# Patient Record
Sex: Female | Born: 1980 | Race: Black or African American | Hispanic: No | Marital: Single | Smoking: Never smoker
Health system: Southern US, Community
[De-identification: ages and names within clinical notes are randomized; demographics above are authoritative.]

## PROBLEM LIST (undated history)

## (undated) DIAGNOSIS — N390 Urinary tract infection, site not specified: Secondary | ICD-10-CM

## (undated) DIAGNOSIS — J45909 Unspecified asthma, uncomplicated: Secondary | ICD-10-CM

## (undated) DIAGNOSIS — K219 Gastro-esophageal reflux disease without esophagitis: Secondary | ICD-10-CM

## (undated) DIAGNOSIS — N12 Tubulo-interstitial nephritis, not specified as acute or chronic: Secondary | ICD-10-CM

## (undated) DIAGNOSIS — A599 Trichomoniasis, unspecified: Secondary | ICD-10-CM

## (undated) DIAGNOSIS — A749 Chlamydial infection, unspecified: Secondary | ICD-10-CM

## (undated) DIAGNOSIS — N83209 Unspecified ovarian cyst, unspecified side: Secondary | ICD-10-CM

## (undated) HISTORY — PX: CARDIAC SURGERY: SHX584

## (undated) HISTORY — PX: HERNIA REPAIR: SHX51

---

## 1999-12-18 ENCOUNTER — Ambulatory Visit (HOSPITAL_COMMUNITY): Admission: RE | Admit: 1999-12-18 | Discharge: 1999-12-18 | Payer: Self-pay | Admitting: General Surgery

## 2000-03-06 ENCOUNTER — Inpatient Hospital Stay (HOSPITAL_COMMUNITY): Admission: AD | Admit: 2000-03-06 | Discharge: 2000-03-06 | Payer: Self-pay | Admitting: Obstetrics

## 2002-04-13 ENCOUNTER — Other Ambulatory Visit: Admission: RE | Admit: 2002-04-13 | Discharge: 2002-04-13 | Payer: Self-pay | Admitting: Gynecology

## 2002-12-14 ENCOUNTER — Encounter: Payer: Self-pay | Admitting: Obstetrics & Gynecology

## 2002-12-14 ENCOUNTER — Ambulatory Visit (HOSPITAL_COMMUNITY): Admission: RE | Admit: 2002-12-14 | Discharge: 2002-12-14 | Payer: Self-pay | Admitting: Obstetrics & Gynecology

## 2003-01-23 ENCOUNTER — Inpatient Hospital Stay (HOSPITAL_COMMUNITY): Admission: AD | Admit: 2003-01-23 | Discharge: 2003-01-23 | Payer: Self-pay | Admitting: Obstetrics

## 2003-03-09 ENCOUNTER — Encounter: Payer: Self-pay | Admitting: Obstetrics & Gynecology

## 2003-03-09 ENCOUNTER — Ambulatory Visit (HOSPITAL_COMMUNITY): Admission: RE | Admit: 2003-03-09 | Discharge: 2003-03-09 | Payer: Self-pay | Admitting: Obstetrics & Gynecology

## 2003-03-23 ENCOUNTER — Encounter (INDEPENDENT_AMBULATORY_CARE_PROVIDER_SITE_OTHER): Payer: Self-pay | Admitting: Specialist

## 2003-03-23 ENCOUNTER — Observation Stay (HOSPITAL_COMMUNITY): Admission: RE | Admit: 2003-03-23 | Discharge: 2003-03-24 | Payer: Self-pay | Admitting: General Surgery

## 2003-04-12 ENCOUNTER — Inpatient Hospital Stay (HOSPITAL_COMMUNITY): Admission: AD | Admit: 2003-04-12 | Discharge: 2003-04-14 | Payer: Self-pay | Admitting: Obstetrics

## 2003-06-17 ENCOUNTER — Inpatient Hospital Stay (HOSPITAL_COMMUNITY): Admission: AD | Admit: 2003-06-17 | Discharge: 2003-06-17 | Payer: Self-pay | Admitting: Obstetrics

## 2003-06-18 ENCOUNTER — Inpatient Hospital Stay (HOSPITAL_COMMUNITY): Admission: AD | Admit: 2003-06-18 | Discharge: 2003-06-18 | Payer: Self-pay | Admitting: Obstetrics & Gynecology

## 2003-07-07 ENCOUNTER — Inpatient Hospital Stay (HOSPITAL_COMMUNITY): Admission: AD | Admit: 2003-07-07 | Discharge: 2003-07-09 | Payer: Self-pay | Admitting: Obstetrics & Gynecology

## 2003-07-14 ENCOUNTER — Inpatient Hospital Stay (HOSPITAL_COMMUNITY): Admission: AD | Admit: 2003-07-14 | Discharge: 2003-07-14 | Payer: Self-pay | Admitting: Obstetrics

## 2003-10-06 ENCOUNTER — Emergency Department (HOSPITAL_COMMUNITY): Admission: EM | Admit: 2003-10-06 | Discharge: 2003-10-06 | Payer: Self-pay | Admitting: Emergency Medicine

## 2004-05-27 ENCOUNTER — Inpatient Hospital Stay (HOSPITAL_COMMUNITY): Admission: AD | Admit: 2004-05-27 | Discharge: 2004-05-27 | Payer: Self-pay | Admitting: Obstetrics

## 2004-06-09 ENCOUNTER — Ambulatory Visit (HOSPITAL_COMMUNITY): Admission: RE | Admit: 2004-06-09 | Discharge: 2004-06-09 | Payer: Self-pay | Admitting: Obstetrics

## 2004-07-17 ENCOUNTER — Inpatient Hospital Stay (HOSPITAL_COMMUNITY): Admission: AD | Admit: 2004-07-17 | Discharge: 2004-07-18 | Payer: Self-pay | Admitting: Obstetrics & Gynecology

## 2004-08-19 ENCOUNTER — Inpatient Hospital Stay (HOSPITAL_COMMUNITY): Admission: AD | Admit: 2004-08-19 | Discharge: 2004-08-19 | Payer: Self-pay | Admitting: Obstetrics & Gynecology

## 2004-09-23 ENCOUNTER — Inpatient Hospital Stay (HOSPITAL_COMMUNITY): Admission: AD | Admit: 2004-09-23 | Discharge: 2004-09-23 | Payer: Self-pay | Admitting: *Deleted

## 2004-09-24 ENCOUNTER — Inpatient Hospital Stay (HOSPITAL_COMMUNITY): Admission: AD | Admit: 2004-09-24 | Discharge: 2004-09-25 | Payer: Self-pay | Admitting: Obstetrics

## 2004-10-03 ENCOUNTER — Inpatient Hospital Stay (HOSPITAL_COMMUNITY): Admission: AD | Admit: 2004-10-03 | Discharge: 2004-10-03 | Payer: Self-pay | Admitting: Obstetrics

## 2004-10-26 ENCOUNTER — Inpatient Hospital Stay (HOSPITAL_COMMUNITY): Admission: AD | Admit: 2004-10-26 | Discharge: 2004-10-26 | Payer: Self-pay | Admitting: Obstetrics & Gynecology

## 2004-11-20 ENCOUNTER — Inpatient Hospital Stay (HOSPITAL_COMMUNITY): Admission: AD | Admit: 2004-11-20 | Discharge: 2004-11-21 | Payer: Self-pay | Admitting: Obstetrics & Gynecology

## 2004-12-08 ENCOUNTER — Inpatient Hospital Stay (HOSPITAL_COMMUNITY): Admission: AD | Admit: 2004-12-08 | Discharge: 2004-12-10 | Payer: Self-pay | Admitting: Obstetrics

## 2007-01-21 ENCOUNTER — Emergency Department (HOSPITAL_COMMUNITY): Admission: EM | Admit: 2007-01-21 | Discharge: 2007-01-21 | Payer: Self-pay | Admitting: *Deleted

## 2007-03-21 ENCOUNTER — Encounter (INDEPENDENT_AMBULATORY_CARE_PROVIDER_SITE_OTHER): Payer: Self-pay | Admitting: Nurse Practitioner

## 2007-03-21 ENCOUNTER — Ambulatory Visit: Payer: Self-pay | Admitting: Family Medicine

## 2007-03-21 ENCOUNTER — Ambulatory Visit: Payer: Self-pay | Admitting: *Deleted

## 2007-03-21 LAB — CONVERTED CEMR LAB
ALT: 18 units/L (ref 0–35)
AST: 19 units/L (ref 0–37)
Alkaline Phosphatase: 78 units/L (ref 39–117)
BUN: 10 mg/dL (ref 6–23)
Basophils Relative: 1 % (ref 0–1)
Calcium: 10.1 mg/dL (ref 8.4–10.5)
Creatinine, Ser: 0.72 mg/dL (ref 0.40–1.20)
Eosinophils Absolute: 0.1 10*3/uL (ref 0.0–0.7)
Eosinophils Relative: 1 % (ref 0–5)
Hemoglobin: 14.9 g/dL (ref 12.0–15.0)
MCV: 90.2 fL (ref 78.0–100.0)
Neutro Abs: 2.2 10*3/uL (ref 1.7–7.7)
Neutrophils Relative %: 40 % — ABNORMAL LOW (ref 43–77)
Platelets: 517 10*3/uL — ABNORMAL HIGH (ref 150–400)
RDW: 13.2 % (ref 11.5–14.0)
TSH: 0.881 microintl units/mL (ref 0.350–5.50)
Total Protein: 8.3 g/dL (ref 6.0–8.3)

## 2007-03-29 ENCOUNTER — Emergency Department (HOSPITAL_COMMUNITY): Admission: EM | Admit: 2007-03-29 | Discharge: 2007-03-29 | Payer: Self-pay | Admitting: Emergency Medicine

## 2007-04-10 ENCOUNTER — Ambulatory Visit: Payer: Self-pay | Admitting: Family Medicine

## 2007-05-07 ENCOUNTER — Emergency Department (HOSPITAL_COMMUNITY): Admission: EM | Admit: 2007-05-07 | Discharge: 2007-05-07 | Payer: Self-pay | Admitting: Emergency Medicine

## 2007-07-31 ENCOUNTER — Ambulatory Visit: Payer: Self-pay | Admitting: Family Medicine

## 2007-12-25 ENCOUNTER — Emergency Department (HOSPITAL_COMMUNITY): Admission: EM | Admit: 2007-12-25 | Discharge: 2007-12-25 | Payer: Self-pay | Admitting: Emergency Medicine

## 2008-01-22 ENCOUNTER — Emergency Department (HOSPITAL_COMMUNITY): Admission: EM | Admit: 2008-01-22 | Discharge: 2008-01-22 | Payer: Self-pay | Admitting: Emergency Medicine

## 2008-04-18 ENCOUNTER — Emergency Department (HOSPITAL_COMMUNITY): Admission: EM | Admit: 2008-04-18 | Discharge: 2008-04-18 | Payer: Self-pay | Admitting: Emergency Medicine

## 2008-08-18 ENCOUNTER — Emergency Department (HOSPITAL_COMMUNITY): Admission: EM | Admit: 2008-08-18 | Discharge: 2008-08-18 | Payer: Self-pay | Admitting: Emergency Medicine

## 2008-09-01 ENCOUNTER — Emergency Department (HOSPITAL_COMMUNITY): Admission: EM | Admit: 2008-09-01 | Discharge: 2008-09-01 | Payer: Self-pay | Admitting: Emergency Medicine

## 2008-09-03 ENCOUNTER — Emergency Department (HOSPITAL_COMMUNITY): Admission: EM | Admit: 2008-09-03 | Discharge: 2008-09-03 | Payer: Self-pay | Admitting: Emergency Medicine

## 2009-02-07 ENCOUNTER — Ambulatory Visit: Payer: Self-pay | Admitting: Obstetrics and Gynecology

## 2009-02-07 ENCOUNTER — Inpatient Hospital Stay (HOSPITAL_COMMUNITY): Admission: AD | Admit: 2009-02-07 | Discharge: 2009-02-07 | Payer: Self-pay | Admitting: Obstetrics & Gynecology

## 2009-05-06 ENCOUNTER — Emergency Department (HOSPITAL_COMMUNITY): Admission: EM | Admit: 2009-05-06 | Discharge: 2009-05-07 | Payer: Self-pay | Admitting: Emergency Medicine

## 2009-06-07 ENCOUNTER — Emergency Department (HOSPITAL_COMMUNITY): Admission: EM | Admit: 2009-06-07 | Discharge: 2009-06-08 | Payer: Self-pay | Admitting: Emergency Medicine

## 2009-07-27 ENCOUNTER — Emergency Department (HOSPITAL_COMMUNITY): Admission: EM | Admit: 2009-07-27 | Discharge: 2009-07-28 | Payer: Self-pay | Admitting: Emergency Medicine

## 2009-10-08 ENCOUNTER — Emergency Department (HOSPITAL_COMMUNITY): Admission: EM | Admit: 2009-10-08 | Discharge: 2009-10-08 | Payer: Self-pay | Admitting: Pediatric Emergency Medicine

## 2009-11-08 ENCOUNTER — Emergency Department (HOSPITAL_COMMUNITY): Admission: EM | Admit: 2009-11-08 | Discharge: 2009-11-08 | Payer: Self-pay | Admitting: Emergency Medicine

## 2009-12-19 ENCOUNTER — Ambulatory Visit: Payer: Self-pay | Admitting: Nurse Practitioner

## 2009-12-19 ENCOUNTER — Inpatient Hospital Stay (HOSPITAL_COMMUNITY): Admission: AD | Admit: 2009-12-19 | Discharge: 2009-12-19 | Payer: Self-pay | Admitting: Obstetrics & Gynecology

## 2010-01-16 ENCOUNTER — Emergency Department (HOSPITAL_COMMUNITY): Admission: EM | Admit: 2010-01-16 | Discharge: 2010-01-16 | Payer: Self-pay | Admitting: Emergency Medicine

## 2010-04-17 ENCOUNTER — Inpatient Hospital Stay (HOSPITAL_COMMUNITY)
Admission: AD | Admit: 2010-04-17 | Discharge: 2010-04-17 | Payer: Self-pay | Source: Home / Self Care | Admitting: Obstetrics

## 2010-06-06 ENCOUNTER — Ambulatory Visit (HOSPITAL_COMMUNITY)
Admission: RE | Admit: 2010-06-06 | Discharge: 2010-06-06 | Payer: Self-pay | Source: Home / Self Care | Attending: Obstetrics & Gynecology | Admitting: Obstetrics & Gynecology

## 2010-06-16 ENCOUNTER — Inpatient Hospital Stay (HOSPITAL_COMMUNITY)
Admission: AD | Admit: 2010-06-16 | Discharge: 2010-06-16 | Disposition: A | Discharge status: Still In Hospital | Payer: Self-pay | Source: Home / Self Care | Attending: Obstetrics | Admitting: Obstetrics

## 2010-06-16 LAB — URINALYSIS, ROUTINE W REFLEX MICROSCOPIC
Hgb urine dipstick: NEGATIVE
Ketones, ur: NEGATIVE mg/dL
Urine Glucose, Fasting: NEGATIVE mg/dL
Urobilinogen, UA: 0.2 mg/dL (ref 0.0–1.0)

## 2010-06-16 LAB — URINE MICROSCOPIC-ADD ON

## 2010-06-18 LAB — URINE CULTURE

## 2010-06-22 ENCOUNTER — Inpatient Hospital Stay (HOSPITAL_COMMUNITY)
Admission: RE | Admit: 2010-06-22 | Discharge: 2010-06-23 | Disposition: A | Discharge status: Home / Self Care | Payer: Medicaid Other | Source: Ambulatory Visit | Attending: Obstetrics | Admitting: Obstetrics

## 2010-06-22 DIAGNOSIS — O210 Mild hyperemesis gravidarum: Secondary | ICD-10-CM

## 2010-06-22 LAB — URINALYSIS, ROUTINE W REFLEX MICROSCOPIC
Hgb urine dipstick: NEGATIVE
Ketones, ur: NEGATIVE mg/dL
Specific Gravity, Urine: 1.01 (ref 1.005–1.030)
Urine Glucose, Fasting: NEGATIVE mg/dL
Urobilinogen, UA: 0.2 mg/dL (ref 0.0–1.0)
pH: 6.5 (ref 5.0–8.0)

## 2010-06-23 DIAGNOSIS — O210 Mild hyperemesis gravidarum: Secondary | ICD-10-CM | POA: Insufficient documentation

## 2010-07-11 ENCOUNTER — Emergency Department (HOSPITAL_COMMUNITY): Payer: Medicaid Other

## 2010-07-11 ENCOUNTER — Emergency Department (HOSPITAL_COMMUNITY)
Admission: EM | Admit: 2010-07-11 | Discharge: 2010-07-11 | Disposition: A | Discharge status: Home / Self Care | Payer: Medicaid Other | Attending: Emergency Medicine | Admitting: Emergency Medicine

## 2010-07-11 ENCOUNTER — Inpatient Hospital Stay (HOSPITAL_COMMUNITY)
Admission: AD | Admit: 2010-07-11 | Discharge: 2010-07-11 | Disposition: A | Discharge status: Home / Self Care | Payer: Medicaid Other | Source: Ambulatory Visit | Attending: Obstetrics | Admitting: Obstetrics

## 2010-07-11 DIAGNOSIS — W010XXA Fall on same level from slipping, tripping and stumbling without subsequent striking against object, initial encounter: Secondary | ICD-10-CM | POA: Insufficient documentation

## 2010-07-11 DIAGNOSIS — Y9329 Activity, other involving ice and snow: Secondary | ICD-10-CM | POA: Insufficient documentation

## 2010-07-11 DIAGNOSIS — M25476 Effusion, unspecified foot: Secondary | ICD-10-CM | POA: Insufficient documentation

## 2010-07-11 DIAGNOSIS — O99891 Other specified diseases and conditions complicating pregnancy: Secondary | ICD-10-CM | POA: Insufficient documentation

## 2010-07-11 DIAGNOSIS — J45909 Unspecified asthma, uncomplicated: Secondary | ICD-10-CM | POA: Insufficient documentation

## 2010-07-11 DIAGNOSIS — M25473 Effusion, unspecified ankle: Secondary | ICD-10-CM | POA: Insufficient documentation

## 2010-07-11 DIAGNOSIS — M25579 Pain in unspecified ankle and joints of unspecified foot: Secondary | ICD-10-CM | POA: Insufficient documentation

## 2010-07-11 DIAGNOSIS — Y929 Unspecified place or not applicable: Secondary | ICD-10-CM | POA: Insufficient documentation

## 2010-08-01 LAB — URINE CULTURE
Colony Count: 25000
Culture  Setup Time: 201111282132

## 2010-08-01 LAB — COMPREHENSIVE METABOLIC PANEL
AST: 26 U/L (ref 0–37)
Alkaline Phosphatase: 47 U/L (ref 39–117)
CO2: 21 mEq/L (ref 19–32)
Calcium: 9.1 mg/dL (ref 8.4–10.5)
Chloride: 103 mEq/L (ref 96–112)
GFR calc Af Amer: >60 mL/min (ref >60)
Glucose, Bld: 67 mg/dL — ABNORMAL LOW (ref 70–99)
Potassium: 4 mEq/L (ref 3.5–5.1)
Sodium: 133 mEq/L — ABNORMAL LOW (ref 135–145)

## 2010-08-01 LAB — URINALYSIS, ROUTINE W REFLEX MICROSCOPIC
Nitrite: NEGATIVE
Protein, ur: NEGATIVE mg/dL

## 2010-08-01 LAB — CBC
HCT: 41.8 % (ref 36.0–46.0)
Hemoglobin: 14 g/dL (ref 12.0–15.0)
MCH: 31.3 pg (ref 26.0–34.0)
Platelets: 299 10*3/uL (ref 150–400)
WBC: 6.3 10*3/uL (ref 4.0–10.5)

## 2010-08-01 LAB — WET PREP, GENITAL: Yeast Wet Prep HPF POC: NONE SEEN

## 2010-08-01 LAB — URINE MICROSCOPIC-ADD ON

## 2010-08-01 LAB — GC/CHLAMYDIA PROBE AMP, GENITAL: GC Probe Amp, Genital: NEGATIVE

## 2010-08-04 LAB — URINE CULTURE
Colony Count: 30000
Culture  Setup Time: 201108300007

## 2010-08-04 LAB — URINALYSIS, ROUTINE W REFLEX MICROSCOPIC
Glucose, UA: NEGATIVE mg/dL
Hgb urine dipstick: NEGATIVE
Ketones, ur: 15 mg/dL — AB
Ketones, ur: NEGATIVE mg/dL
Nitrite: NEGATIVE
Protein, ur: NEGATIVE mg/dL
Protein, ur: NEGATIVE mg/dL
Urobilinogen, UA: 1 mg/dL (ref 0.0–1.0)

## 2010-08-04 LAB — URINE MICROSCOPIC-ADD ON

## 2010-08-04 LAB — WET PREP, GENITAL

## 2010-08-04 LAB — POCT PREGNANCY, URINE: Preg Test, Ur: NEGATIVE

## 2010-08-04 LAB — GC/CHLAMYDIA PROBE AMP, GENITAL: Chlamydia, DNA Probe: NEGATIVE

## 2010-08-06 LAB — URINALYSIS, ROUTINE W REFLEX MICROSCOPIC
Bilirubin Urine: NEGATIVE
Glucose, UA: NEGATIVE mg/dL
Glucose, UA: NEGATIVE mg/dL
Hgb urine dipstick: NEGATIVE
Nitrite: NEGATIVE
Specific Gravity, Urine: 1.019 (ref 1.005–1.030)
Specific Gravity, Urine: 1.016 (ref 1.005–1.030)
Urobilinogen, UA: 1 mg/dL (ref 0.0–1.0)
pH: 7 (ref 5.0–8.0)

## 2010-08-06 LAB — BASIC METABOLIC PANEL
CO2: 22 mEq/L (ref 19–32)
Calcium: 9.3 mg/dL (ref 8.4–10.5)
GFR calc Af Amer: >60 mL/min (ref >60)
GFR calc non Af Amer: >60 mL/min (ref >60)
Sodium: 135 mEq/L (ref 135–145)

## 2010-08-06 LAB — URINE MICROSCOPIC-ADD ON

## 2010-08-06 LAB — CBC
Hemoglobin: 14.5 g/dL (ref 12.0–15.0)
RBC: 4.78 MIL/uL (ref 3.87–5.11)

## 2010-08-06 LAB — DIFFERENTIAL
Lymphocytes Relative: 48 % — ABNORMAL HIGH (ref 12–46)
Monocytes Absolute: 0.7 10*3/uL (ref 0.1–1.0)
Monocytes Relative: 9 % (ref 3–12)
Neutro Abs: 3.2 10*3/uL (ref 1.7–7.7)

## 2010-08-06 LAB — POCT PREGNANCY, URINE: Preg Test, Ur: NEGATIVE

## 2010-08-12 ENCOUNTER — Inpatient Hospital Stay (HOSPITAL_COMMUNITY)
Admission: AD | Admit: 2010-08-12 | Discharge: 2010-08-12 | Disposition: A | Discharge status: Home / Self Care | Payer: Medicaid Other | Source: Ambulatory Visit | Attending: Obstetrics | Admitting: Obstetrics

## 2010-08-12 DIAGNOSIS — O99891 Other specified diseases and conditions complicating pregnancy: Secondary | ICD-10-CM | POA: Insufficient documentation

## 2010-08-12 DIAGNOSIS — O9989 Other specified diseases and conditions complicating pregnancy, childbirth and the puerperium: Secondary | ICD-10-CM

## 2010-08-12 DIAGNOSIS — J069 Acute upper respiratory infection, unspecified: Secondary | ICD-10-CM | POA: Insufficient documentation

## 2010-08-12 LAB — URINALYSIS, ROUTINE W REFLEX MICROSCOPIC
Bilirubin Urine: NEGATIVE
Hgb urine dipstick: NEGATIVE
Specific Gravity, Urine: <1.005 — ABNORMAL LOW (ref 1.005–1.030)
Urobilinogen, UA: 0.2 mg/dL (ref 0.0–1.0)

## 2010-08-12 LAB — URINE MICROSCOPIC-ADD ON

## 2010-08-13 LAB — URINE CULTURE: Culture  Setup Time: 201203242030

## 2010-08-21 LAB — DIFFERENTIAL
Basophils Absolute: 0.1 10*3/uL (ref 0.0–0.1)
Basophils Relative: 2 % — ABNORMAL HIGH (ref 0–1)
Lymphocytes Relative: 35 % (ref 12–46)
Neutro Abs: 4 10*3/uL (ref 1.7–7.7)
Neutrophils Relative %: 56 % (ref 43–77)

## 2010-08-21 LAB — URINALYSIS, ROUTINE W REFLEX MICROSCOPIC
Glucose, UA: NEGATIVE mg/dL
Ketones, ur: NEGATIVE mg/dL
Specific Gravity, Urine: 1.027 (ref 1.005–1.030)
pH: 6.5 (ref 5.0–8.0)

## 2010-08-21 LAB — CBC
Hemoglobin: 15.7 g/dL — ABNORMAL HIGH (ref 12.0–15.0)
MCHC: 34.3 g/dL (ref 30.0–36.0)
RDW: 12.4 % (ref 11.5–15.5)

## 2010-08-21 LAB — URINE MICROSCOPIC-ADD ON

## 2010-08-21 LAB — BASIC METABOLIC PANEL
CO2: 26 mEq/L (ref 19–32)
Calcium: 9.3 mg/dL (ref 8.4–10.5)
Creatinine, Ser: 0.77 mg/dL (ref 0.4–1.2)
GFR calc Af Amer: >60 mL/min (ref >60)
GFR calc non Af Amer: >60 mL/min (ref >60)
Glucose, Bld: 104 mg/dL — ABNORMAL HIGH (ref 70–99)
Sodium: 131 mEq/L — ABNORMAL LOW (ref 135–145)

## 2010-08-21 LAB — URINE CULTURE
Colony Count: NO GROWTH
Culture: NO GROWTH

## 2010-09-05 ENCOUNTER — Inpatient Hospital Stay (HOSPITAL_COMMUNITY)
Admission: AD | Admit: 2010-09-05 | Discharge: 2010-09-05 | Disposition: A | Discharge status: Home / Self Care | Payer: Medicaid Other | Source: Ambulatory Visit | Attending: Obstetrics | Admitting: Obstetrics

## 2010-09-05 DIAGNOSIS — O4700 False labor before 37 completed weeks of gestation, unspecified trimester: Secondary | ICD-10-CM

## 2010-09-05 DIAGNOSIS — A499 Bacterial infection, unspecified: Secondary | ICD-10-CM | POA: Insufficient documentation

## 2010-09-05 DIAGNOSIS — O2390 Unspecified genitourinary tract infection in pregnancy, unspecified trimester: Secondary | ICD-10-CM | POA: Insufficient documentation

## 2010-09-05 DIAGNOSIS — B9689 Other specified bacterial agents as the cause of diseases classified elsewhere: Secondary | ICD-10-CM | POA: Insufficient documentation

## 2010-09-05 DIAGNOSIS — N760 Acute vaginitis: Secondary | ICD-10-CM

## 2010-09-05 LAB — URINE MICROSCOPIC-ADD ON

## 2010-09-05 LAB — URINALYSIS, ROUTINE W REFLEX MICROSCOPIC
Nitrite: NEGATIVE
Specific Gravity, Urine: 1.02 (ref 1.005–1.030)
Urobilinogen, UA: 1 mg/dL (ref 0.0–1.0)
pH: 6.5 (ref 5.0–8.0)

## 2010-09-05 LAB — WET PREP, GENITAL

## 2010-09-06 LAB — URINE CULTURE: Colony Count: NO GROWTH

## 2010-09-06 LAB — GC/CHLAMYDIA PROBE AMP, GENITAL: Chlamydia, DNA Probe: NEGATIVE

## 2010-09-30 ENCOUNTER — Inpatient Hospital Stay (HOSPITAL_COMMUNITY)
Admission: AD | Admit: 2010-09-30 | Discharge: 2010-09-30 | Disposition: A | Discharge status: Home / Self Care | Payer: Medicaid Other | Source: Ambulatory Visit | Attending: Obstetrics & Gynecology | Admitting: Obstetrics & Gynecology

## 2010-09-30 DIAGNOSIS — B9789 Other viral agents as the cause of diseases classified elsewhere: Secondary | ICD-10-CM

## 2010-09-30 DIAGNOSIS — J029 Acute pharyngitis, unspecified: Secondary | ICD-10-CM | POA: Insufficient documentation

## 2010-09-30 DIAGNOSIS — O9989 Other specified diseases and conditions complicating pregnancy, childbirth and the puerperium: Secondary | ICD-10-CM

## 2010-09-30 DIAGNOSIS — O99891 Other specified diseases and conditions complicating pregnancy: Secondary | ICD-10-CM | POA: Insufficient documentation

## 2010-09-30 LAB — URINALYSIS, ROUTINE W REFLEX MICROSCOPIC
Bilirubin Urine: NEGATIVE
Glucose, UA: NEGATIVE mg/dL
Hgb urine dipstick: NEGATIVE
Protein, ur: NEGATIVE mg/dL

## 2010-09-30 LAB — CBC
HCT: 32.8 % — ABNORMAL LOW (ref 36.0–46.0)
MCH: 27.8 pg (ref 26.0–34.0)
MCV: 85.2 fL (ref 78.0–100.0)
RBC: 3.85 MIL/uL — ABNORMAL LOW (ref 3.87–5.11)
RDW: 13.6 % (ref 11.5–15.5)
WBC: 7.7 10*3/uL (ref 4.0–10.5)

## 2010-10-06 NOTE — Op Note (Signed)
[facility]  Patient:    Tamara Waller, Tamara Waller                      MRN: [id number] Proc. Date: 12/18/99 Adm. Date:  [id number] Attending:  Tempie Donning                           Operative Report  OPERATION PERFORMED:  Explore abdominal wall for hernia and repair of epigastric hernia.  SURGEON:  Gita Kudo, M.D.  ANESTHESIA:  General.  PREOPERATIVE DIAGNOSIS:  Epigastric hernia.  POSTOPERATIVE DIAGNOSIS:  Weakening of midline abdominal wall, no definite fascial defect.  BRIEF HISTORY:  A 30 year old female seen on November 13, 1999 in the office for a tender abdominal nodule.  There was a three month history of a tender nodule in her abdominal wall midway between her umbilicus and xiphoid.  On physical examination, she had a small tender nodule consistent with epigastric hernia midway between the xiphoid and umbilicus.  FINDINGS:  I did not feel any abdominal pathology through the skin.  In the area above, there was a weakening of the abdominal wall, but there was no nodule, midline defect.  DESCRIPTION OF PROCEDURE:  Under satisfactory general endotracheal anesthesia, having received 1.0 g Ancef preop, in anticipation of mesh, the patients abdomen was prepped and draped in the standard fashion.  Careful examination did not reveal the defect and I opened the abdominal wall in the midline in the midportion between xiphoid and umbilicus.  This was carried down to the midline and I then dissected the skin and subcu away from the abdominal wall fascia.  The patient was quite lean and this was done very easily.  I freed the entire area and then extended the incision so that I covered the entire area between the umbilicus and the xiphoid except for the most proximal 1-2 cm and most distal 1-2 cm.  I still did not see a definite defect.  I then opened the midline carefully into the preperitoneum and did not enter the peritoneal cavity.  Finger  dissection was used to free away the abdominal wall and preperitoneal contents and then with a finger underneath the abdominal wall, I again examined it and could not feel a defect.  The midline fascia appeared strong throughout.  It was slightly thin.  There was a bulging of the preperitoneum, but it did not go through any fascial defect.  Then the midline was closed with running 0 PDS suture interspersed with approximately four or five interrupted 0 Prolene with the sutures buried.  The subcu was then closed with 3-0 Vicryl and the skin with staples.  Sterile absorbent dressings applied and the patient went to the recovery room from the operating room in good condition. DD:  12/18/99 TD:  12/19/99 Job: [id number]

## 2010-10-06 NOTE — Op Note (Signed)
NAME:  Tamara Waller, Tamara Waller                       ACCOUNT NO.:  [id number]   MEDICAL RECORD NO.:  [id number]                   PATIENT TYPE:  OBV   LOCATION:  9399                                 FACILITY:  [facility]   PHYSICIAN:  Leonie Man, M.D.                DATE OF BIRTH:  25-Feb-1981   DATE OF PROCEDURE:  03/23/2003  DATE OF DISCHARGE:                                 OPERATIVE REPORT   PREOPERATIVE DIAGNOSES:  Incarcerated epigastric hernia in a patient who is  in her second trimester of pregnancy.   POSTOPERATIVE DIAGNOSIS:  Incarcerated epigastric hernia in a patient who is  in her second trimester of pregnancy.   PROCEDURE:  Repair of epigastric hernia in pregnant female.   SURGEON:  Leonie Man, M.D.   ASSISTANT:  Nurse.   ANESTHESIA:  General.   NOTE:  This patient is a 30 year old young woman who presents with recurring  episodes of epigastric pain located just above the umbilicus.  At time of  evaluation she was at that time five months pregnant.  The hernia was not  particularly tender at that time but has become increasingly tender over the  past several weeks as her uterus enlarges.  She comes to the operating room  now for repair of this hernia after the risks and potential benefits of  surgery have been fully discussed, all questions answered, and consent  obtained.   DESCRIPTION OF PROCEDURE:  Following the induction of satisfactory  anesthesia, she is positioned supinely.  The abdomen was prepped and draped  to be included in the sterile operative field.  A midline incision is made  just above the umbilicus over the hernia, deepened through the skin and  subcutaneous tissue, carrying the dissection down to the incarcerated mass,  which was dissected free on all sides.  The sac was dissected away and  removed and the incarcerated contents, which was omentum, was reduced back  into the peritoneal cavity without difficulty.  I then used a Seprafilm  slurry to  placed into the peritoneal cavity to prevent adhesions to the  mesh.  I used a medium Bard mesh plug to fill the defect, and this was sewn  in with interrupted sutures of 0 Novofil.  At the end, this repair was noted  to be intact.  Sponge, instruments, and sharp counts were verified.  Subcutaneous tissues were then closed  with interrupted 2-0 Vicryl sutures and the skin was closed in a running 4-0  Monocryl suture.  The skin was then reinforced with Steri-Strips and sterile  dressings were applied, anesthetic reversed, the patient removed from the  operating room to the recovery room in stable condition.  She tolerated the  procedure well.  Leonie Man, M.D.    PB/MEDQ  D:  03/23/2003  T:  03/23/2003  Job:  [id number]   cc:   Roseanna Rainbow, M.D.  [address]  [place]  [place] [address]  Fax: [phone]

## 2010-10-24 ENCOUNTER — Inpatient Hospital Stay (HOSPITAL_COMMUNITY)
Admission: AD | Admit: 2010-10-24 | Discharge: 2010-10-26 | DRG: 775 | Disposition: A | Discharge status: Home / Self Care | Payer: Medicaid Other | Source: Ambulatory Visit | Attending: Obstetrics | Admitting: Obstetrics

## 2010-10-24 DIAGNOSIS — O99892 Other specified diseases and conditions complicating childbirth: Principal | ICD-10-CM | POA: Diagnosis present

## 2010-10-24 DIAGNOSIS — Z22330 Carrier of Group B streptococcus: Secondary | ICD-10-CM

## 2010-10-24 LAB — CBC
HCT: 34.4 % — ABNORMAL LOW (ref 36.0–46.0)
Hemoglobin: 11.5 g/dL — ABNORMAL LOW (ref 12.0–15.0)
MCH: 27.6 pg (ref 26.0–34.0)
MCHC: 33.4 g/dL (ref 30.0–36.0)

## 2010-10-26 LAB — CBC
HCT: 31.8 % — ABNORMAL LOW (ref 36.0–46.0)
MCHC: 32.7 g/dL (ref 30.0–36.0)
MCV: 84.1 fL (ref 78.0–100.0)
RDW: 14.4 % (ref 11.5–15.5)

## 2010-12-08 IMAGING — CT CT ANGIO CHEST
2 of 6 series · 19 of 46 positions shown · IV contrast (APPLIED)
Comparison: Chest radiograph performed 01/22/2008

CLINICAL DATA: Intermittent pleuritic left chest pain for several
days; elevated D-dimer.

CT ANGIOGRAPHY CHEST WITH CONTRAST
TECHNIQUE: Multidetector CT imaging of the chest was performed
using the standard protocol during bolus administration of
intravenous contrast.  Multiplanar CT image reconstructions
including MIPs were obtained to evaluate the vascular anatomy.
Contrast:  80 mL of Omnipaque 300 IV contrast

[Series 6: pe thins @ 1mm · axial · 0.60mm/px · z∈[-133,+81]mm · 16 of 236 slices shown]
[im 11/236  lung]
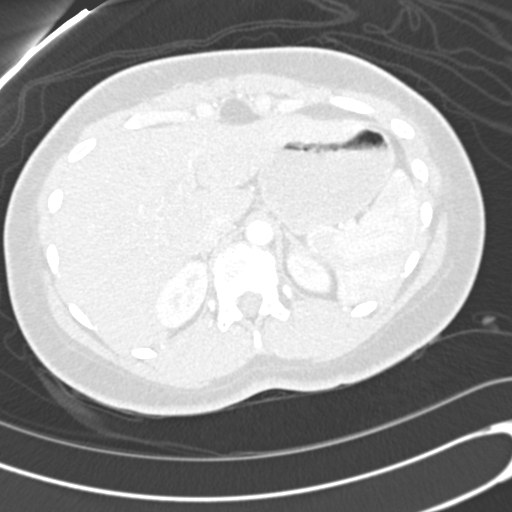
[im 31/236  soft-tissue]
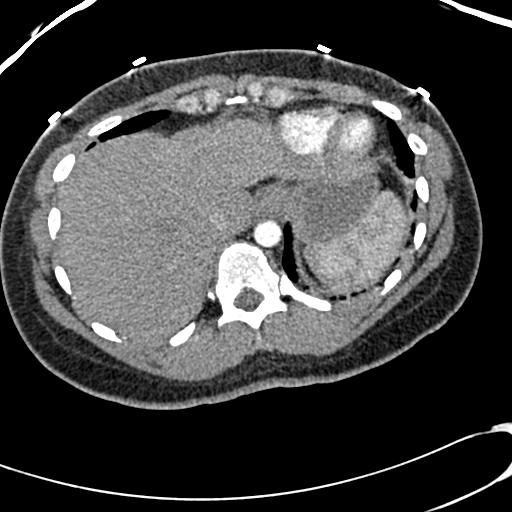
[im 41/236  lung]
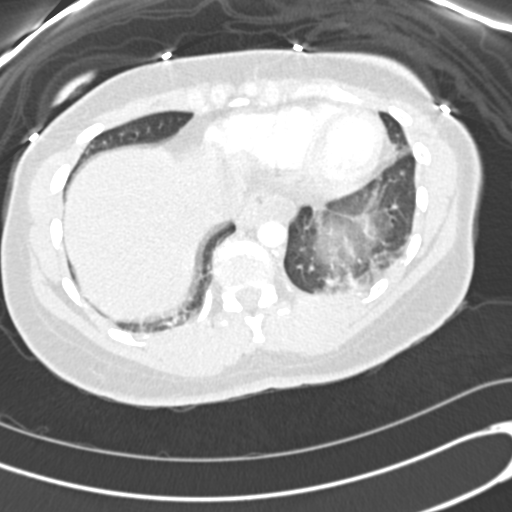
[im 52/236  soft-tissue]
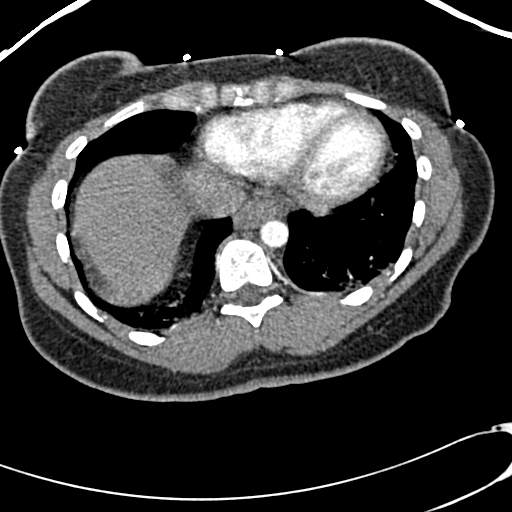
[im 72/236  lung]
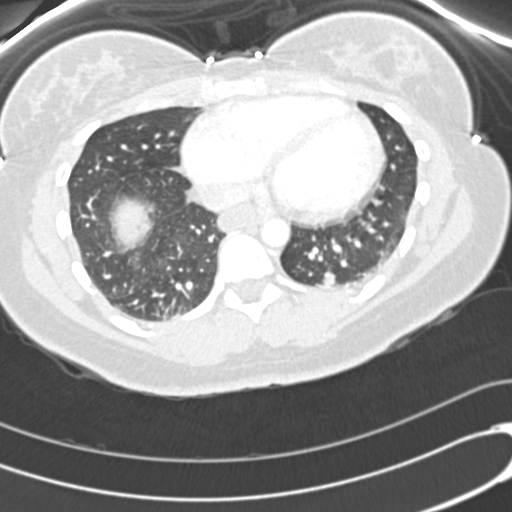
[im 82/236  soft-tissue]
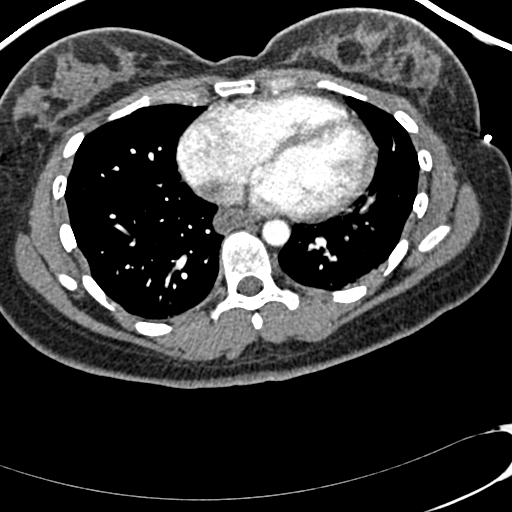
[im 92/236  lung]
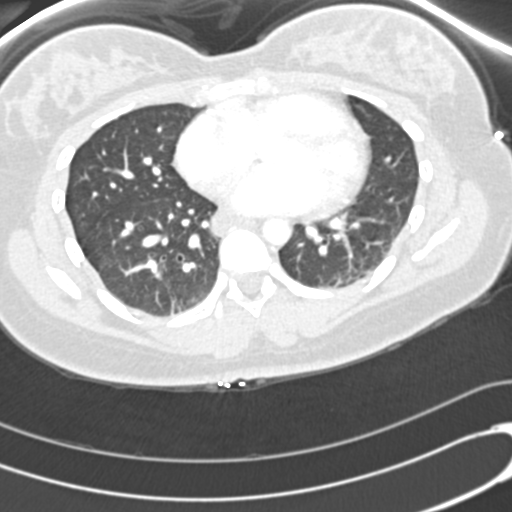
[im 113/236  soft-tissue]
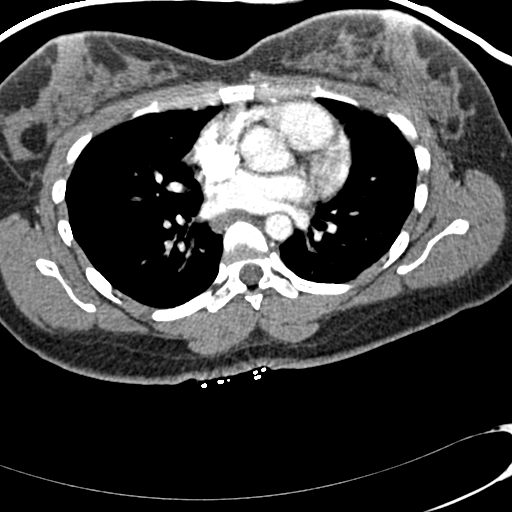
[im 123/236  lung]
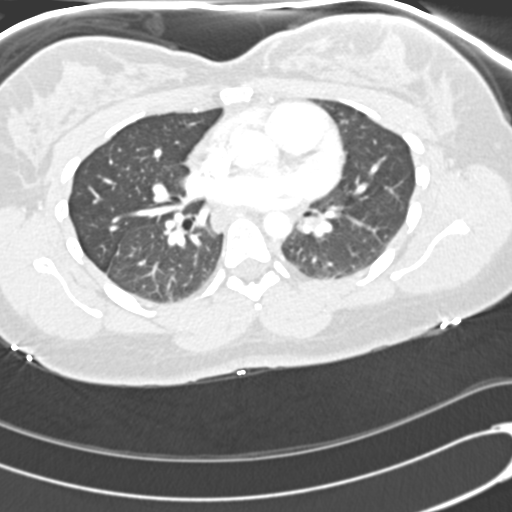
[im 144/236  soft-tissue]
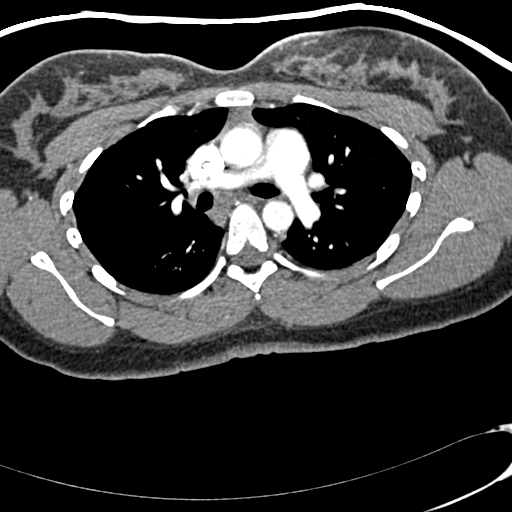
[im 154/236  lung]
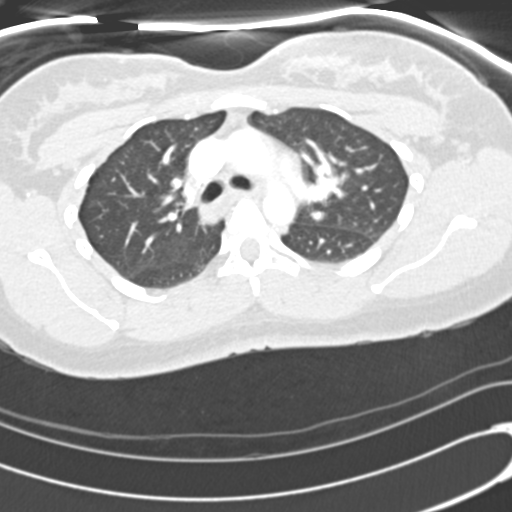
[im 164/236  soft-tissue]
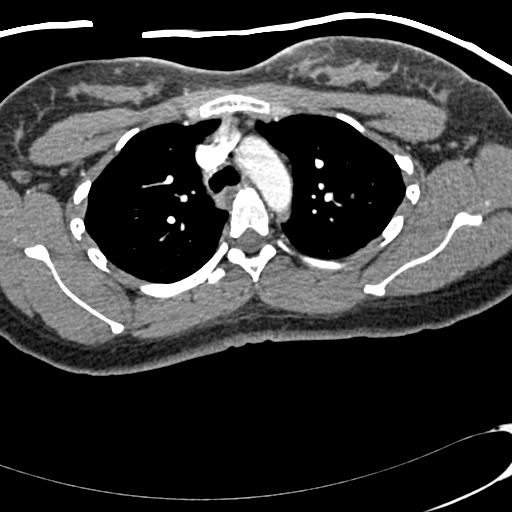
[im 184/236  lung]
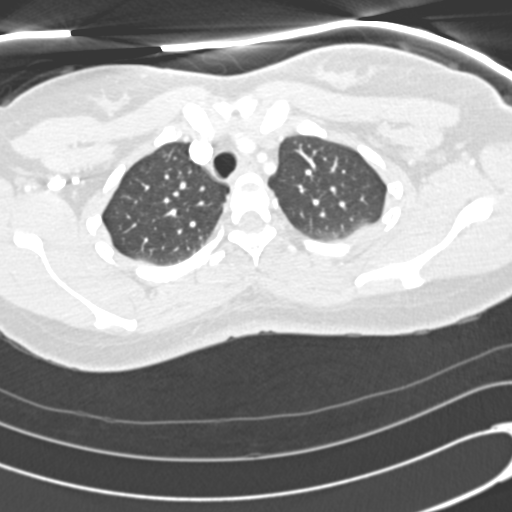
[im 195/236  soft-tissue]
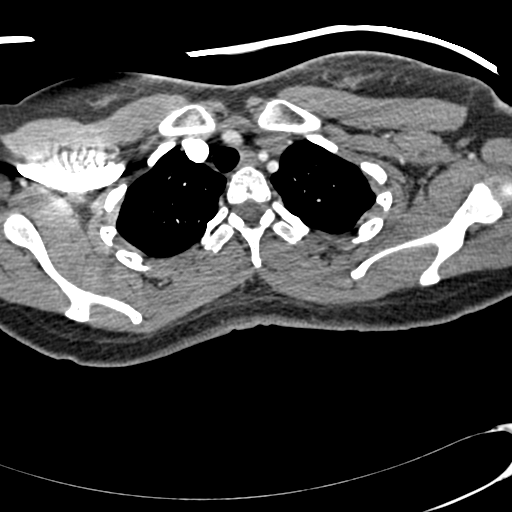
[im 205/236  lung]
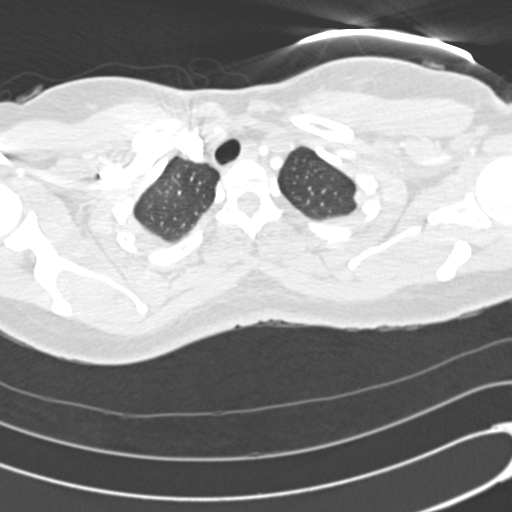
[im 225/236  soft-tissue]
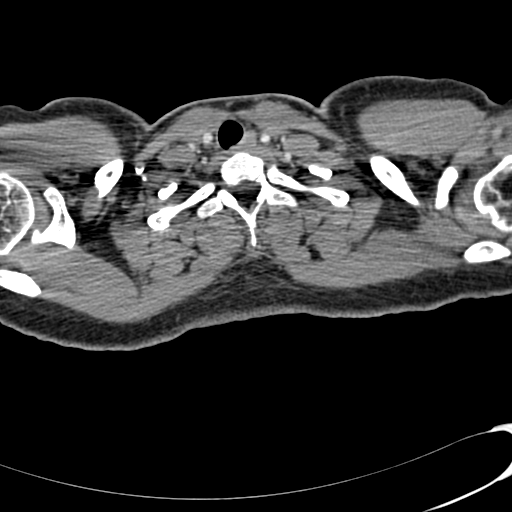

[Series 602: <mpr thick range> · coronal · 0.60mm/px · 3 of 78 slices shown]
[im 20/78  soft-tissue]
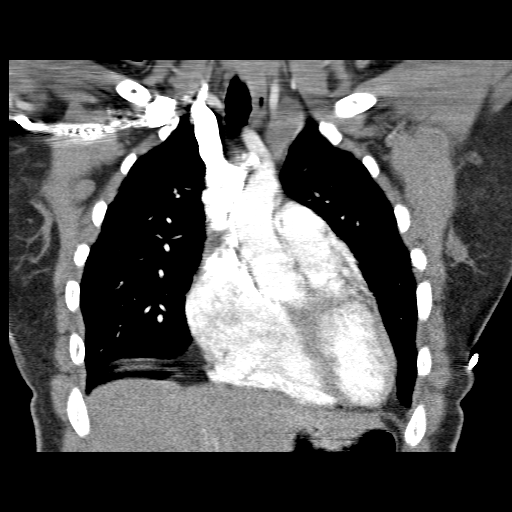
[im 39/78  soft-tissue]
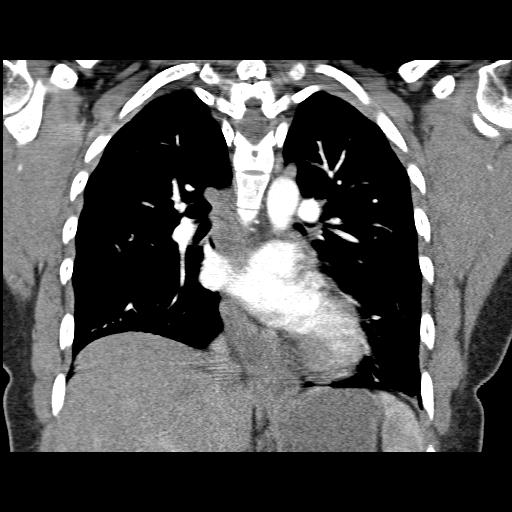
[im 58/78  soft-tissue]
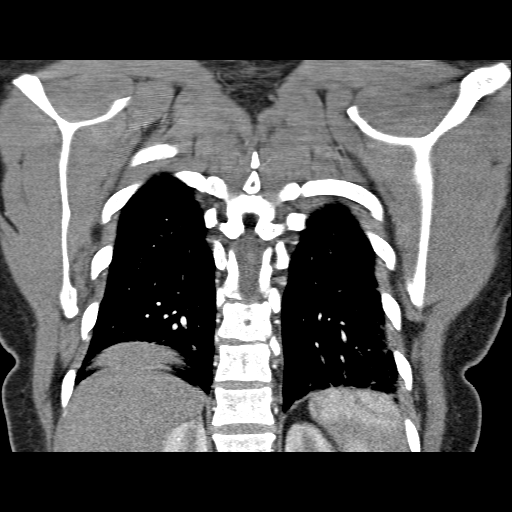

[19 of 46 positions shown; findings below may reference images not displayed]

FINDINGS: There is no evidence of pulmonary embolus.

Bibasilar atelectasis is noted.  There is no evidence of
significant focal consolidation, pleural effusion or pneumothorax.
No masses are identified; no abnormal focal contrast enhancement is
seen.

The mediastinum is unremarkable in appearance.  A bovine aortic
arch is incidentally noted.  No mediastinal lymphadenopathy is
appreciated.  The great vessels are otherwise unremarkable in
appearance.  No pericardial effusion is seen.  No axillary
lymphadenopathy is seen.

The visualized portions of the liver and spleen are unremarkable.

No acute osseous abnormalities are seen.

Review of the MIP images confirms the above findings.
IMPRESSION: 1.  No evidence of pulmonary embolus.
2.  Bibasilar atelectasis noted.  Lungs otherwise clear.

## 2011-02-16 LAB — URINALYSIS, ROUTINE W REFLEX MICROSCOPIC
Bilirubin Urine: NEGATIVE
Glucose, UA: NEGATIVE
Ketones, ur: NEGATIVE
Leukocytes, UA: NEGATIVE
Protein, ur: NEGATIVE
Urobilinogen, UA: 1
pH: 6

## 2011-02-16 LAB — WET PREP, GENITAL: Trich, Wet Prep: NONE SEEN

## 2011-02-16 LAB — URINE MICROSCOPIC-ADD ON

## 2011-02-20 LAB — RAPID STREP SCREEN (MED CTR MEBANE ONLY): Streptococcus, Group A Screen (Direct): NEGATIVE

## 2011-02-27 LAB — I-STAT 8, (EC8 V) (CONVERTED LAB)
BUN: 9
Bicarbonate: 25 — ABNORMAL HIGH
Chloride: 104
Glucose, Bld: 85
HCT: 45
Hemoglobin: 15.3 — ABNORMAL HIGH
Operator id: 196461
Potassium: 3.9
Sodium: 138
TCO2: 26
pCO2, Ven: 42.5 — ABNORMAL LOW
pH, Ven: 7.377 — ABNORMAL HIGH

## 2011-02-27 LAB — POCT I-STAT CREATININE
Creatinine, Ser: 0.9
Operator id: 196461

## 2012-02-08 ENCOUNTER — Emergency Department (HOSPITAL_COMMUNITY)
Admission: EM | Admit: 2012-02-08 | Discharge: 2012-02-08 | Disposition: A | Discharge status: Home / Self Care | Payer: Medicaid Other | Source: Home / Self Care | Attending: Emergency Medicine | Admitting: Emergency Medicine

## 2012-02-08 ENCOUNTER — Encounter (HOSPITAL_COMMUNITY): Payer: Self-pay

## 2012-02-08 DIAGNOSIS — N159 Renal tubulo-interstitial disease, unspecified: Secondary | ICD-10-CM

## 2012-02-08 DIAGNOSIS — M549 Dorsalgia, unspecified: Secondary | ICD-10-CM

## 2012-02-08 HISTORY — DX: Tubulo-interstitial nephritis, not specified as acute or chronic: N12

## 2012-02-08 HISTORY — DX: Urinary tract infection, site not specified: N39.0

## 2012-02-08 HISTORY — DX: Unspecified asthma, uncomplicated: J45.909

## 2012-02-08 HISTORY — DX: Trichomoniasis, unspecified: A59.9

## 2012-02-08 HISTORY — DX: Gastro-esophageal reflux disease without esophagitis: K21.9

## 2012-02-08 HISTORY — DX: Chlamydial infection, unspecified: A74.9

## 2012-02-08 LAB — POCT URINALYSIS DIP (DEVICE)
Ketones, ur: NEGATIVE mg/dL
Leukocytes, UA: NEGATIVE
Nitrite: NEGATIVE
Protein, ur: NEGATIVE mg/dL

## 2012-02-08 LAB — URINALYSIS, ROUTINE W REFLEX MICROSCOPIC
Glucose, UA: NEGATIVE mg/dL
Ketones, ur: NEGATIVE mg/dL
Leukocytes, UA: NEGATIVE
Nitrite: NEGATIVE
Specific Gravity, Urine: 1.02 (ref 1.005–1.030)
pH: 7 (ref 5.0–8.0)

## 2012-02-08 LAB — POCT PREGNANCY, URINE: Preg Test, Ur: NEGATIVE

## 2012-02-08 MED ORDER — SULFAMETHOXAZOLE-TRIMETHOPRIM 800-160 MG PO TABS: 1.0000 | ORAL_TABLET | Freq: Two times a day (BID) | ORAL | Status: DC

## 2012-02-08 MED ORDER — IBUPROFEN 600 MG PO TABS: 600.0000 mg | ORAL_TABLET | Freq: Four times a day (QID) | ORAL | Status: DC | PRN

## 2012-02-08 NOTE — ED Notes (Signed)
C/o LBP radiates to right side x 2 days

## 2012-02-08 NOTE — ED Provider Notes (Signed)
History     CSN: [id number]  Arrival date & time 02/08/12  1037   First MD Initiated Contact with Patient 02/08/12 1052      Chief Complaint  Patient presents with  . Back Pain    (Consider location/radiation/quality/duration/timing/severity/associated sxs/prior treatment) HPI Comments: Patient with intermittent, waxing and waning, nonradiating, nonmigratory achy right-sided back and flank pain for the past 10 days. Reports cloudy urine today. Pain seems to be worse when lying flat, and going from lying to standing. Tried 400 mg ibuprofen without improvement.  No nausea, vomiting, hematuria, urgency, frequency, dysuria. No abdominal pain. No constipation or diarrhea, states that last bowel movement was this morning, was normal, did not affect her back pain. No vaginal bleeding or discharge. She is in a long-term monogamous relationship with a female partner, who is asymptomatic. No recent change in physical activity, recent or remote history of trauma to her back. States this feels similar to previous episode of pyelonephritis. Reports having UTI-like symptoms 2 weeks ago with urgency,  frequency, which resolved with increased fluids. No personal or family history of kidney stones. Remote history of Chlamydia, Trichomonas.     Patient is a 31 y.o. female presenting with back pain. The history is provided by the patient. No language interpreter was used.  Back Pain  This is a new problem. The current episode started more than 1 week ago. The problem occurs daily. The problem has been gradually worsening. The pain is present in the lumbar spine. The quality of the pain is described as aching. The pain does not radiate. The symptoms are aggravated by certain positions. The pain is worse during the day. Pertinent negatives include no chest pain, no fever, no numbness, no weight loss, no abdominal pain, no abdominal swelling, no bowel incontinence, no perianal numbness, no bladder incontinence, no  dysuria, no pelvic pain, no leg pain, no paresthesias, no paresis, no tingling and no weakness. She has tried NSAIDs for the symptoms. The treatment provided mild relief.    Past Medical History  Diagnosis Date  . Asthma   . GERD (gastroesophageal reflux disease)   . UTI (urinary tract infection)   . Pyelonephritis   . Chlamydia   . Trichomonas     Past Surgical History  Procedure Date  . Hernia repair   . Cardiac surgery     as newborn    History reviewed. No pertinent family history.  History  Substance Use Topics  . Smoking status: Never Smoker   . Smokeless tobacco: Not on file  . Alcohol Use: No    OB History    Grav Para Term Preterm Abortions TAB SAB Ect Mult Living                  Review of Systems  Constitutional: Negative for fever and weight loss.  Cardiovascular: Negative for chest pain.  Gastrointestinal: Negative for abdominal pain and bowel incontinence.  Genitourinary: Negative for bladder incontinence, dysuria and pelvic pain.  Musculoskeletal: Positive for back pain.  Neurological: Negative for tingling, weakness, numbness and paresthesias.    Allergies  Review of patient's allergies indicates no known allergies.  Home Medications   Current Outpatient Rx  Name Route Sig Dispense Refill  . IBUPROFEN 600 MG PO TABS Oral Take 1 tablet (600 mg total) by mouth every 6 (six) hours as needed for pain. 30 tablet 0  . SULFAMETHOXAZOLE-TRIMETHOPRIM 800-160 MG PO TABS Oral Take 1 tablet by mouth 2 (two) times daily. X 7 days  14 tablet 0    BP 108/76  Pulse 79  Temp 98.4 F (36.9 C) (Oral)  Resp 16  SpO2 99%  LMP 01/20/2012  Physical Exam  Nursing note and vitals reviewed. Constitutional: She is oriented to person, place, and time. She appears well-developed and well-nourished. No distress.  HENT:  Head: Normocephalic and atraumatic.  Eyes: Conjunctivae normal and EOM are normal.  Neck: Normal range of motion.  Cardiovascular: Normal rate.    Pulmonary/Chest: Effort normal.  Abdominal: She exhibits no distension. There is no tenderness. There is CVA tenderness.       Healed surgical scars. Right CVA, flank tenderness. No suprapubic tenderness  Musculoskeletal: Normal range of motion.       Bilateral lower extremities nontender,  intact PT pulses. No pain with PROM hips bilaterally. SLR neg bilaterally. Sensation baseline light touch bilaterally for Pt, DTR's symmetric and intact bilaterally KJ  Motor symmetric bilateral 5/5 hip flexion, quadriceps, hamstrings, EHL, foot dorsiflexion, foot plantarflexion  Neurological: She is alert and oriented to person, place, and time. Coordination and gait normal.  Skin: Skin is warm and dry.  Psychiatric: She has a normal mood and affect. Her behavior is normal. Judgment and thought content normal.    ED Course  Procedures (including critical care time)   Labs Reviewed  POCT URINALYSIS DIP (DEVICE)  POCT PREGNANCY, URINE  URINE CULTURE  URINALYSIS, ROUTINE W REFLEX MICROSCOPIC   No results found.   1. Back pain   2. Kidney infection     Results for orders placed during the hospital encounter of 02/08/12  POCT URINALYSIS DIP (DEVICE)      Component Value Range   Glucose, UA NEGATIVE  NEGATIVE mg/dL   Bilirubin Urine NEGATIVE  NEGATIVE   Ketones, ur NEGATIVE  NEGATIVE mg/dL   Specific Gravity, Urine 1.020  1.005 - 1.030   Hgb urine dipstick NEGATIVE  NEGATIVE   pH 7.0  5.0 - 8.0   Protein, ur NEGATIVE  NEGATIVE mg/dL   Urobilinogen, UA 0.2  0.0 - 1.0 mg/dL   Nitrite NEGATIVE  NEGATIVE   Leukocytes, UA NEGATIVE  NEGATIVE  POCT PREGNANCY, URINE      Component Value Range   Preg Test, Ur NEGATIVE  NEGATIVE   Urine cloudy visual inspection  MDM  Previous records reviewed. Additional medical history obtained.  [place] narcotic database reviewed. Pt with 1 narcotic rx this year - 08/29/11.   Udip noted. Will send off microscopy and culture. H&P most consistent with upper UTI  given her CVA tenderness, mild flank tenderness with palpation, and recent UTI-like symptoms. Abdomen is otherwise benign. Patient has no GU complaints. Doubt constipation given history. Lungs clear. No red flags such as fevers, night sweats, history of trauma with bony tenderness, neurological deficits, bladder/bowel incontinence, urinary retention, history of cancer, unexplained weight loss, pain worse at night, history of prolonged steroid use, history of osteopenia, history of IVDU, history of HIV.   will treat empirically with bactrim, ibu. Instructed patient to give [place] a working phone number so we can contact her if needed. Discussed signs and symptoms that should prompt return to the department. Patient agrees with plan.    Luiz Blare, MD 02/08/12 1215

## 2012-02-09 LAB — URINE CULTURE

## 2012-02-11 NOTE — ED Notes (Signed)
Urine culture shows: Multiple bacterial morphotypes present, none predominant.  Pt adequately treated at visit with Septra DS.

## 2012-02-27 ENCOUNTER — Emergency Department (HOSPITAL_COMMUNITY)
Admission: EM | Admit: 2012-02-27 | Discharge: 2012-02-28 | Disposition: A | Discharge status: Home / Self Care | Payer: Medicaid Other | Attending: Emergency Medicine | Admitting: Emergency Medicine

## 2012-02-27 ENCOUNTER — Encounter (HOSPITAL_COMMUNITY): Payer: Self-pay | Admitting: Emergency Medicine

## 2012-02-27 ENCOUNTER — Emergency Department (HOSPITAL_COMMUNITY): Payer: Medicaid Other

## 2012-02-27 DIAGNOSIS — J45909 Unspecified asthma, uncomplicated: Secondary | ICD-10-CM | POA: Insufficient documentation

## 2012-02-27 DIAGNOSIS — R0789 Other chest pain: Secondary | ICD-10-CM

## 2012-02-27 DIAGNOSIS — R071 Chest pain on breathing: Secondary | ICD-10-CM | POA: Insufficient documentation

## 2012-02-27 LAB — CBC WITH DIFFERENTIAL/PLATELET
Basophils Absolute: 0 10*3/uL (ref 0.0–0.1)
Basophils Relative: 0 % (ref 0–1)
Eosinophils Absolute: 0.1 10*3/uL (ref 0.0–0.7)
Eosinophils Relative: 1 % (ref 0–5)
HCT: 40.8 % (ref 36.0–46.0)
Hemoglobin: 14.6 g/dL (ref 12.0–15.0)
Lymphocytes Relative: 37 % (ref 12–46)
Lymphs Abs: 3.2 10*3/uL (ref 0.7–4.0)
MCH: 31.1 pg (ref 26.0–34.0)
MCHC: 35.8 g/dL (ref 30.0–36.0)
MCV: 86.8 fL (ref 78.0–100.0)
Monocytes Absolute: 0.5 10*3/uL (ref 0.1–1.0)
Monocytes Relative: 6 % (ref 3–12)
Neutro Abs: 4.8 10*3/uL (ref 1.7–7.7)
Neutrophils Relative %: 56 % (ref 43–77)
Platelets: 397 10*3/uL (ref 150–400)
RBC: 4.7 MIL/uL (ref 3.87–5.11)
RDW: 12.4 % (ref 11.5–15.5)
WBC: 8.5 10*3/uL (ref 4.0–10.5)

## 2012-02-27 NOTE — ED Notes (Signed)
Pt states her chest pain started around 5 this afternoon, pain got worse at home, pt describes pain as a intermittent sharp pains. Pt denies N/V and sob. Pt has cardiac history of congenital heart defects.

## 2012-02-28 LAB — COMPREHENSIVE METABOLIC PANEL
ALT: 14 U/L (ref 0–35)
AST: 18 U/L (ref 0–37)
Albumin: 4.1 g/dL (ref 3.5–5.2)
Alkaline Phosphatase: 82 U/L (ref 39–117)
BUN: 9 mg/dL (ref 6–23)
CO2: 24 mEq/L (ref 19–32)
Calcium: 9.6 mg/dL (ref 8.4–10.5)
Chloride: 103 mEq/L (ref 96–112)
Creatinine, Ser: 0.66 mg/dL (ref 0.50–1.10)
GFR calc Af Amer: >90 mL/min (ref >90)
GFR calc non Af Amer: >90 mL/min (ref >90)
Glucose, Bld: 80 mg/dL (ref 70–99)
Potassium: 3.8 mEq/L (ref 3.5–5.1)
Sodium: 137 mEq/L (ref 135–145)
Total Bilirubin: 0.2 mg/dL — ABNORMAL LOW (ref 0.3–1.2)
Total Protein: 7.8 g/dL (ref 6.0–8.3)

## 2012-02-28 LAB — POCT I-STAT TROPONIN I: Troponin i, poc: 0 ng/mL (ref 0.00–0.08)

## 2012-02-28 MED ORDER — KETOROLAC TROMETHAMINE 30 MG/ML IJ SOLN
30.0000 mg | Freq: Once | INTRAMUSCULAR | Status: AC
  Administered 2012-02-28: 30 mg via INTRAVENOUS
  Filled 2012-02-28: qty 1

## 2012-02-28 MED ORDER — SODIUM CHLORIDE 0.9 % IJ SOLN
10.0000 mL | INTRAMUSCULAR | Status: DC | PRN
  Administered 2012-02-28: 10 mL via INTRAVENOUS

## 2012-02-28 NOTE — ED Provider Notes (Signed)
Medical screening examination/treatment/procedure(s) were performed by non-physician practitioner and as supervising physician I was immediately available for consultation/collaboration.  Andron Marrazzo, MD 02/28/12 0736 

## 2012-02-28 NOTE — ED Provider Notes (Signed)
History     CSN: [id number]  Arrival date & time 02/27/12  2219   First MD Initiated Contact with Patient 02/28/12 0001      Chief Complaint  Patient presents with  . Chest Pain    (Consider location/radiation/quality/duration/timing/severity/associated sxs/prior treatment) HPI History provided by pt.   Pt presents w/ acute onset intermittent, shooting right anterior CP at 4:30pm today.  Pain has gradually worsened.  Pleuritic and also aggravated by laying flat, drinking water and particular movements.  No associated fever, SOB, cough, abd pain, N/V/D, rash or LE pain/edema.  No known trauma but moved a dresser today.  Pt on birth control; otherwise no RF for blood clot.  Had open heart surgery as a neonate and has h/o asthma but otherwise healthy.  Experienced similar pain at age 24 but resolved spontaneously.  Past Medical History  Diagnosis Date  . Asthma   . GERD (gastroesophageal reflux disease)   . UTI (urinary tract infection)   . Pyelonephritis   . Chlamydia   . Trichomonas     Past Surgical History  Procedure Date  . Hernia repair   . Cardiac surgery     as newborn    No family history on file.  History  Substance Use Topics  . Smoking status: Never Smoker   . Smokeless tobacco: Not on file  . Alcohol Use: No    OB History    Grav Para Term Preterm Abortions TAB SAB Ect Mult Living                  Review of Systems  All other systems reviewed and are negative.    Allergies  Review of patient's allergies indicates no known allergies.  Home Medications   Current Outpatient Rx  Name Route Sig Dispense Refill  . ALBUTEROL SULFATE HFA 108 (90 BASE) MCG/ACT IN AERS Inhalation Inhale 2 puffs into the lungs every 6 (six) hours as needed.    . IBUPROFEN 600 MG PO TABS Oral Take 1 tablet (600 mg total) by mouth every 6 (six) hours as needed for pain. 30 tablet 0  . ADULT MULTIVITAMIN W/MINERALS CH Oral Take 1 tablet by mouth daily.    .  SULFAMETHOXAZOLE-TRIMETHOPRIM 800-160 MG PO TABS Oral Take 1 tablet by mouth 2 (two) times daily. X 7 days 14 tablet 0    BP 137/93  Pulse 86  Temp 97.6 F (36.4 C) (Oral)  Resp 19  SpO2 100%  LMP 01/20/2012  Physical Exam  Nursing note and vitals reviewed. Constitutional: She is oriented to person, place, and time. She appears well-developed and well-nourished. No distress.  HENT:  Head: Normocephalic and atraumatic.  Eyes:       Normal appearance  Neck: Normal range of motion.  Cardiovascular: Normal rate, regular rhythm and intact distal pulses.   Pulmonary/Chest: Effort normal and breath sounds normal. No respiratory distress.       No pleuritic pain reported.  Very mild tenderness right anterior chest.  Pain reproducible w/ abduction of RUE.    Abdominal: Soft. Bowel sounds are normal. She exhibits no distension. There is no tenderness. There is no guarding.  Musculoskeletal: Normal range of motion.       No peripheral edema or calf tenderness  Neurological: She is alert and oriented to person, place, and time.  Skin: Skin is warm and dry. No rash noted.  Psychiatric: She has a normal mood and affect. Her behavior is normal.    ED Course  Procedures (including critical care time)  Date: 02/28/2012  Rate:85  Rhythm: normal sinus rhythm and sinus arrhythmia  QRS Axis: normal  Intervals: normal  ST/T Wave abnormalities: normal  Conduction Disutrbances:none  Narrative Interpretation:   Old EKG Reviewed: none available   Labs Reviewed  COMPREHENSIVE METABOLIC PANEL - Abnormal; Notable for the following:    Total Bilirubin 0.2 (*)     All other components within normal limits  CBC WITH DIFFERENTIAL  POCT I-STAT TROPONIN I   Dg Chest 2 View  02/27/2012  *RADIOLOGY REPORT*  Clinical Data: 31 year old female right side chest pain, cough. History of cardiac surgery is a child.  CHEST - 2 VIEW  Comparison: 06/08/2009 and earlier.  Findings: Lung volumes are stable and  within normal limits.  Stable cardiac size, at the upper limits of normal. Other mediastinal contours are within normal limits.  Visualized tracheal air column is within normal limits.  Stable mild chronic increased interstitial markings.  Otherwise the lungs are clear.  No pneumothorax or effusion. No acute osseous abnormality identified.  IMPRESSION: No acute cardiopulmonary abnormality.   Original Report Authenticated By: Ulla Potash III, M.D.      1. Chest wall pain       MDM  31yo F presents w/ non-traumatic, R sided CP since yesterday evening.  Pain reproducible w/ abduction of RUE on exam.  CXR neg for pneumonia/pneumothorax.  Low risk ACS, EKG non-ischemic and troponin ordered by nursing staff neg.  PE unlikely; No SOB, no tachycardia/tachypnea, no s/sx DVT and only RF is birth control.  Pt likely has a muscle strain.  Received 30mg  IV toradol and pain improved greatly.  She has 600mg  ibuprofen at home.  Strict return precautions discussed.  2:29 AM         Otilio Miu, PA 02/28/12 [phone]

## 2012-02-28 NOTE — ED Notes (Signed)
Pt states understanding of discharge instructions 

## 2012-03-15 ENCOUNTER — Emergency Department (HOSPITAL_COMMUNITY)
Admission: EM | Admit: 2012-03-15 | Discharge: 2012-03-15 | Disposition: A | Discharge status: Home / Self Care | Payer: No Typology Code available for payment source | Attending: Emergency Medicine | Admitting: Emergency Medicine

## 2012-03-15 ENCOUNTER — Emergency Department (HOSPITAL_COMMUNITY): Payer: No Typology Code available for payment source

## 2012-03-15 DIAGNOSIS — M799 Soft tissue disorder, unspecified: Secondary | ICD-10-CM | POA: Insufficient documentation

## 2012-03-15 DIAGNOSIS — Y9389 Activity, other specified: Secondary | ICD-10-CM | POA: Insufficient documentation

## 2012-03-15 DIAGNOSIS — S39012A Strain of muscle, fascia and tendon of lower back, initial encounter: Secondary | ICD-10-CM

## 2012-03-15 DIAGNOSIS — IMO0002 Reserved for concepts with insufficient information to code with codable children: Secondary | ICD-10-CM | POA: Insufficient documentation

## 2012-03-15 MED ORDER — IBUPROFEN 800 MG PO TABS
800.0000 mg | ORAL_TABLET | Freq: Once | ORAL | Status: AC
  Administered 2012-03-15: 800 mg via ORAL
  Filled 2012-03-15: qty 1

## 2012-03-15 MED ORDER — NAPROXEN 500 MG PO TABS: 500.0000 mg | ORAL_TABLET | Freq: Two times a day (BID) | ORAL | Status: DC
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
## 2012-03-15 NOTE — ED Notes (Signed)
Pt verbalizes understanding 
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
## 2012-03-27 ENCOUNTER — Ambulatory Visit: Payer: Medicaid Other | Admitting: Cardiology

## 2012-03-27 ENCOUNTER — Encounter: Payer: Self-pay | Admitting: Cardiology

## 2012-07-29 ENCOUNTER — Emergency Department (HOSPITAL_COMMUNITY)
Admission: EM | Admit: 2012-07-29 | Discharge: 2012-07-29 | Disposition: A | Discharge status: Home / Self Care | Payer: Medicaid Other | Source: Home / Self Care | Attending: Emergency Medicine | Admitting: Emergency Medicine

## 2012-07-29 ENCOUNTER — Encounter (HOSPITAL_COMMUNITY): Payer: Self-pay | Admitting: *Deleted

## 2012-07-29 DIAGNOSIS — J069 Acute upper respiratory infection, unspecified: Secondary | ICD-10-CM

## 2012-07-29 MED ORDER — BENZONATATE 200 MG PO CAPS: 200.0000 mg | ORAL_CAPSULE | Freq: Three times a day (TID) | ORAL | Status: DC | PRN

## 2012-07-29 MED ORDER — IBUPROFEN 800 MG PO TABS
ORAL_TABLET | ORAL | Status: AC
  Filled 2012-07-29: qty 1

## 2012-07-29 MED ORDER — NAPROXEN 500 MG PO TABS: 500.0000 mg | ORAL_TABLET | Freq: Two times a day (BID) | ORAL | Status: DC

## 2012-07-29 MED ORDER — ALBUTEROL SULFATE HFA 108 (90 BASE) MCG/ACT IN AERS: 1.0000 | INHALATION_SPRAY | Freq: Four times a day (QID) | RESPIRATORY_TRACT | Status: DC | PRN

## 2012-07-29 MED ORDER — IBUPROFEN 800 MG PO TABS
800.0000 mg | ORAL_TABLET | Freq: Once | ORAL | Status: AC
  Administered 2012-07-29: 800 mg via ORAL

## 2012-07-29 NOTE — ED Notes (Signed)
Pt  Reports  Symptoms  Of  Cough      Scratchy  Throat      Yellow  Sputum  Production         X    3  Days  - pt  Reports  Pain l  Upper  Breast  whuich  Is  assiociated  With    Cough    And  Cerain  posistions      She  Ambulated  To  Room  With a  Steady  Fluid  Gait           She  Is  Sitting  Upright on  Exam table  Speaking in  Complete  sentances

## 2012-07-29 NOTE — ED Provider Notes (Signed)
Chief Complaint:   Chief Complaint  Patient presents with  . Cough    History of Present Illness:   Tamara Waller is a 32 year old female who presents with a three-day history of sore throat and cough productive yellow sputum. She notes some sharp chest pain when she coughs, pushes, or pulls. She denies any fever, chills, nasal congestion, drainage, or GI symptoms. She has not been exposed to anything in particular.  Review of Systems:  Other than noted above, the patient denies any of the following symptoms: Systemic:  No fevers, chills, sweats, weight loss or gain, fatigue, or tiredness. Eye:  No redness or discharge. ENT:  No ear pain, drainage, headache, nasal congestion, drainage, sinus pressure, difficulty swallowing, or sore throat. Neck:  No neck pain or swollen glands. Lungs:  No cough, sputum production, hemoptysis, wheezing, chest tightness, shortness of breath or chest pain. GI:  No abdominal pain, nausea, vomiting or diarrhea.  PMFSH:  Past medical history, family history, social history, meds, and allergies were reviewed. She has no allergies and takes no medications. 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 She has not used tobacco or alcohol, and she's not pregnant or breast-feeding.  Physical Exam:   Vital signs:  BP 108/68  Pulse 67  Temp(Src) 97.7 F (36.5 C) (Oral)  SpO2 100%  LMP 07/28/2012 General:  Alert and oriented.  In no distress.  Skin warm and dry. Eye:  No conjunctival injection or drainage. Lids were normal. ENT:  TMs and canals were normal, without erythema or inflammation.  Nasal mucosa was clear and uncongested, without drainage.  Mucous membranes were moist.  Pharynx was clear with no exudate or drainage.  There were no oral ulcerations or lesions. Neck:  Supple, no adenopathy, tenderness or mass. Lungs:  No respiratory distress.  Lungs were clear to auscultation, without wheezes,  rales or rhonchi.  Breath sounds were clear and equal bilaterally.  Heart:  Regular rhythm, without gallops, murmers or rubs. Skin:  Clear, warm, and dry, without rash or lesions.   Labs:   Results for orders placed during the hospital encounter of 07/29/12  POCT RAPID STREP A ([facility] ONLY)      Result Value Range   Streptococcus, Group A Screen (Direct) NEGATIVE  NEGATIVE    Assessment:  The encounter diagnosis was Viral upper respiratory infection.  This appears to be a clear-cut viral upper respiratory infection and thus does not require antibiotics. Will treat symptomatically. I suggested she return if symptoms haven't gotten better in a week.  Plan:   1.  The following meds were prescribed:   Discharge Medication List as of 07/29/2012  3:26 PM    START taking these medications   Details  albuterol (PROVENTIL HFA;VENTOLIN HFA) 108 (90 BASE) MCG/ACT inhaler Inhale 1-2 puffs into the lungs every 6 (six) hours as needed for wheezing., Starting 07/29/2012, Until Discontinued, Normal    benzonatate (TESSALON) 200 MG capsule Take 1 capsule (200 mg total) by mouth 3 (three) times daily as needed for cough., Starting 07/29/2012, Until Discontinued, Normal    !! naproxen (NAPROSYN) 500 MG tablet Take 1 tablet (500 mg total) by mouth 2 (two) times daily., Starting 07/29/2012, Until Discontinued, Normal     !! - Potential duplicate medications found. Please discuss with Malachi Suderman.     2.  The patient was instructed in symptomatic care and handouts were given. 3.  The patient  was told to return if becoming worse in any way, if no better in 3 or 4 days, and given some red flag symptoms such as fever, difficulty breathing, or vomiting, that would indicate earlier return.      Reuben Likes, MD 07/29/12 [phone]

## 2012-08-29 ENCOUNTER — Ambulatory Visit: Payer: Self-pay | Admitting: Obstetrics

## 2012-10-23 ENCOUNTER — Encounter: Payer: Self-pay | Admitting: Obstetrics

## 2012-10-23 ENCOUNTER — Ambulatory Visit (INDEPENDENT_AMBULATORY_CARE_PROVIDER_SITE_OTHER): Payer: Medicaid Other | Admitting: Obstetrics

## 2012-10-23 VITALS — BP 116/77 | HR 85 | Temp 98.1°F | Ht 62.3 in | Wt 157.2 lb

## 2012-10-23 DIAGNOSIS — N760 Acute vaginitis: Secondary | ICD-10-CM | POA: Insufficient documentation

## 2012-10-23 DIAGNOSIS — Z0000 Encounter for general adult medical examination without abnormal findings: Secondary | ICD-10-CM

## 2012-10-23 DIAGNOSIS — Z113 Encounter for screening for infections with a predominantly sexual mode of transmission: Secondary | ICD-10-CM

## 2012-10-23 MED ORDER — METRONIDAZOLE 500 MG PO TABS: 500.0000 mg | ORAL_TABLET | Freq: Two times a day (BID) | ORAL | Status: DC

## 2012-10-23 NOTE — Progress Notes (Signed)
.   Subjective:     Tamara Waller is a 32 y.o. female here for a routine exam.  Current complaints: abnormal discharge with odor.  She would like all STD testing and her Mirena strings checked..  Personal health questionnaire reviewed: yes.   Gynecologic History No LMP recorded. Contraception: IUD Last Pap: 01/2012. Results were: normal Last mammogram: N/A  Obstetric History OB History   Grav Para Term Preterm Abortions TAB SAB Ect Mult Living                   The following portions of the patient's history were reviewed and updated as appropriate: allergies, current medications, past family history, past medical history, past social history, past surgical history and problem list.  Review of Systems Pertinent items are noted in HPI.    Objective:    General appearance: alert and no distress Abdomen: normal findings: soft, non-tender Pelvic: cervix normal in appearance, external genitalia normal, no adnexal masses or tenderness, no cervical motion tenderness, uterus normal size, shape, and consistency and vagina with grayish malodorous discharge.    Assessment:    BV   Plan:    Education reviewed: safe sex/STD prevention and management of BV.. Follow up in: several months. Flagyl Rx.

## 2012-10-24 LAB — HIV ANTIBODY (ROUTINE TESTING W REFLEX): HIV: NONREACTIVE

## 2012-10-24 LAB — GC/CHLAMYDIA PROBE AMP: GC Probe RNA: NEGATIVE

## 2012-10-24 LAB — WET PREP BY MOLECULAR PROBE: Candida species: NEGATIVE

## 2012-11-25 ENCOUNTER — Encounter (HOSPITAL_COMMUNITY): Payer: Self-pay

## 2012-11-25 ENCOUNTER — Inpatient Hospital Stay (HOSPITAL_COMMUNITY)
Admission: AD | Admit: 2012-11-25 | Discharge: 2012-11-26 | Disposition: A | Discharge status: Home / Self Care | Payer: Medicaid Other | Source: Ambulatory Visit | Attending: Obstetrics | Admitting: Obstetrics

## 2012-11-25 ENCOUNTER — Inpatient Hospital Stay (HOSPITAL_COMMUNITY): Payer: Medicaid Other

## 2012-11-25 DIAGNOSIS — D271 Benign neoplasm of left ovary: Secondary | ICD-10-CM

## 2012-11-25 DIAGNOSIS — D279 Benign neoplasm of unspecified ovary: Secondary | ICD-10-CM | POA: Insufficient documentation

## 2012-11-25 DIAGNOSIS — N760 Acute vaginitis: Secondary | ICD-10-CM | POA: Insufficient documentation

## 2012-11-25 DIAGNOSIS — B9689 Other specified bacterial agents as the cause of diseases classified elsewhere: Secondary | ICD-10-CM | POA: Insufficient documentation

## 2012-11-25 DIAGNOSIS — R109 Unspecified abdominal pain: Secondary | ICD-10-CM | POA: Insufficient documentation

## 2012-11-25 DIAGNOSIS — T8339XA Other mechanical complication of intrauterine contraceptive device, initial encounter: Secondary | ICD-10-CM | POA: Insufficient documentation

## 2012-11-25 DIAGNOSIS — T8332XA Displacement of intrauterine contraceptive device, initial encounter: Secondary | ICD-10-CM

## 2012-11-25 DIAGNOSIS — A499 Bacterial infection, unspecified: Secondary | ICD-10-CM | POA: Insufficient documentation

## 2012-11-25 LAB — URINE MICROSCOPIC-ADD ON

## 2012-11-25 LAB — URINALYSIS, ROUTINE W REFLEX MICROSCOPIC
Glucose, UA: NEGATIVE mg/dL
Glucose, UA: NEGATIVE mg/dL
Ketones, ur: 15 mg/dL — AB
Leukocytes, UA: NEGATIVE
Nitrite: NEGATIVE
pH: 6.5 (ref 5.0–8.0)
pH: 6 (ref 5.0–8.0)

## 2012-11-25 LAB — CBC
HCT: 41 % (ref 36.0–46.0)
MCH: 30.7 pg (ref 26.0–34.0)
MCHC: 35.4 g/dL (ref 30.0–36.0)
MCV: 86.7 fL (ref 78.0–100.0)
RDW: 12.7 % (ref 11.5–15.5)

## 2012-11-25 LAB — POCT PREGNANCY, URINE: Preg Test, Ur: NEGATIVE

## 2012-11-25 MED ORDER — KETOROLAC TROMETHAMINE 60 MG/2ML IM SOLN
60.0000 mg | Freq: Once | INTRAMUSCULAR | Status: AC
  Administered 2012-11-25: 60 mg via INTRAMUSCULAR
  Filled 2012-11-25: qty 2

## 2012-11-25 NOTE — MAU Provider Note (Signed)
Chief Complaint: Abdominal Pain   First Provider Initiated Contact with Patient 11/25/12 2156     SUBJECTIVE HPI: Tamara Waller is a 32 y.o. [phone] female who presents with moderate to severe low abd cramping and spotting x a few days. Denies dyspareunia, urinary complaints, GI complaints, fever, chills. IUD in place x 2 years. She states she is in a mutually monogamous relationship. Currently sexually active.  Past Medical History  Diagnosis Date  . Asthma   . GERD (gastroesophageal reflux disease)   . UTI (urinary tract infection)   . Pyelonephritis   . Chlamydia   . Trichomonas    OB History   Grav Para Term Preterm Abortions TAB SAB Ect Mult Living   3 3 2 1  0 0 0 0 0 3     # Outc Date GA Lbr Len/2nd Wgt Sex Del Anes PTL Lv   1 TRM      SVD   Yes   2 TRM      SVD   Yes   3 PRE  [redacted]w[redacted]d   M SVD   Yes     Past Surgical History  Procedure Laterality Date  . Hernia repair    . Cardiac surgery      as newborn   History   Social History  . Marital Status: Single    Spouse Name: N/A    Number of Children: N/A  . Years of Education: N/A   Occupational History  . Not on file.   Social History Main Topics  . Smoking status: Never Smoker   . Smokeless tobacco: Not on file  . Alcohol Use: No  . Drug Use: No  . Sexually Active: Yes    Birth Control/ Protection: IUD   Other Topics Concern  . Not on file   Social History Narrative  . No narrative on file   No current facility-administered medications on file prior to encounter.   Current Outpatient Prescriptions on File Prior to Encounter  Medication Sig Dispense Refill  . albuterol (PROVENTIL HFA;VENTOLIN HFA) 108 (90 BASE) MCG/ACT inhaler Inhale 1-2 puffs into the lungs every 6 (six) hours as needed for wheezing.  1 Inhaler  0  . Biotin 10 MG TABS Take 1 tablet by mouth daily.       . cholecalciferol (VITAMIN D) 1000 UNITS tablet Take 1,000 Units by mouth daily.       No Known Allergies  ROS: Pertinent  items in HPI  OBJECTIVE Blood pressure 126/77, pulse 101, temperature 98.9 F (37.2 C), temperature source Oral, resp. rate 18, height 5' 2.5" (1.588 m), weight 68.607 kg (151 lb 4 oz), last menstrual period 10/20/2012. GENERAL: Well-developed, well-nourished female in no acute distress.  HEENT: Normocephalic HEART: normal rate RESP: normal effort ABDOMEN: Soft, non-tender EXTREMITIES: Nontender, no edema NEURO: Alert and oriented SPECULUM EXAM: NEFG, small amount of creamy, pink malodorous blood noted, cervix clean. BIMANUAL: cervix closed; uterus normal size, no adnexal tenderness or masses. No CMT. 5 cm IUD strings seen and tip of IUD visible in ext os.   LAB RESULTS Results for orders placed during the hospital encounter of 11/25/12 (from the past 24 hour(s))  URINALYSIS, ROUTINE W REFLEX MICROSCOPIC     Status: Abnormal   Collection Time    11/25/12  8:20 PM      Result Value Range   Color, Urine YELLOW  YELLOW   APPearance CLEAR  CLEAR   Specific Gravity, Urine 1.025  1.005 - 1.030  pH 6.5  5.0 - 8.0   Glucose, UA NEGATIVE  NEGATIVE mg/dL   Hgb urine dipstick LARGE (*) NEGATIVE   Bilirubin Urine SMALL (*) NEGATIVE   Ketones, ur 15 (*) NEGATIVE mg/dL   Protein, ur NEGATIVE  NEGATIVE mg/dL   Urobilinogen, UA 4.0 (*) 0.0 - 1.0 mg/dL   Nitrite NEGATIVE  NEGATIVE   Leukocytes, UA TRACE (*) NEGATIVE  URINE MICROSCOPIC-ADD ON     Status: Abnormal   Collection Time    11/25/12  8:20 PM      Result Value Range   Squamous Epithelial / LPF MANY (*) RARE   WBC, UA 0-2  <3 WBC/hpf   RBC / HPF 3-6  <3 RBC/hpf   Bacteria, UA MANY (*) RARE   Urine-Other MUCOUS PRESENT    POCT PREGNANCY, URINE     Status: None   Collection Time    11/25/12  8:43 PM      Result Value Range   Preg Test, Ur NEGATIVE  NEGATIVE  CBC     Status: None   Collection Time    11/25/12  9:06 PM      Result Value Range   WBC 8.7  4.0 - 10.5 K/uL   RBC 4.73  3.87 - 5.11 MIL/uL   Hemoglobin 14.5  12.0 -  15.0 g/dL   HCT 45.4  09.8 - 11.9 %   MCV 86.7  78.0 - 100.0 fL   MCH 30.7  26.0 - 34.0 pg   MCHC 35.4  30.0 - 36.0 g/dL   RDW 14.7  82.9 - 56.2 %   Platelets 368  150 - 400 K/uL  URINALYSIS, ROUTINE W REFLEX MICROSCOPIC     Status: Abnormal   Collection Time    11/25/12 10:15 PM      Result Value Range   Color, Urine YELLOW  YELLOW   APPearance CLEAR  CLEAR   Specific Gravity, Urine >1.030 (*) 1.005 - 1.030   pH 6.0  5.0 - 8.0   Glucose, UA NEGATIVE  NEGATIVE mg/dL   Hgb urine dipstick TRACE (*) NEGATIVE   Bilirubin Urine NEGATIVE  NEGATIVE   Ketones, ur 15 (*) NEGATIVE mg/dL   Protein, ur NEGATIVE  NEGATIVE mg/dL   Urobilinogen, UA >1.3 (*) 0.0 - 1.0 mg/dL   Nitrite NEGATIVE  NEGATIVE   Leukocytes, UA NEGATIVE  NEGATIVE  URINE MICROSCOPIC-ADD ON     Status: Abnormal   Collection Time    11/25/12 10:15 PM      Result Value Range   Squamous Epithelial / LPF FEW (*) RARE   WBC, UA 0-2  <3 WBC/hpf   RBC / HPF 0-2  <3 RBC/hpf   Urine-Other MUCOUS PRESENT    WET PREP, GENITAL     Status: Abnormal   Collection Time    11/26/12 12:35 AM      Result Value Range   Yeast Wet Prep HPF POC NONE SEEN  NONE SEEN   Trich, Wet Prep NONE SEEN  NONE SEEN   Clue Cells Wet Prep HPF POC MODERATE (*) NONE SEEN   WBC, Wet Prep HPF POC FEW (*) NONE SEEN    IMAGING US Transvaginal Non-ob  11/26/2012   *RADIOLOGY REPORT*  Clinical Data: Pelvic pain.  IUD placement.  TRANSABDOMINAL AND TRANSVAGINAL ULTRASOUND OF PELVIS  Technique:  Both transabdominal and transvaginal ultrasound examinations of the pelvis were performed including evaluation of the uterus, ovaries, adnexal regions, and pelvic cul-de-sac.  Comparison: None.  Findings:  Uterus:  8.3 x 4.1 x 5.5 cm.  Normal echotexture.  No focal abnormality.  Endometrium: 6 mm in thickness.  IUD visualized within the endometrium in the region of the lower uterine segment.  Right Ovary: 3.5 x 1.8 x 1.8 cm. Normal size and echotexture.  No adnexal masses.   Left Ovary: 3.5 x 2.7 x 2.6 cm.  There is an echogenic shadowing mass within the left ovary measuring 2.2 x 2.0 x 1.6 cm compatible with dermoid.  Other Findings:  No free fluid.  IMPRESSION: IUD within the lower uterine segment region of the endometrium.  Echogenic, calcified shadowing structure in the left ovary compatible with a dermoid.   Original Report Authenticated By: Charlett Nose, M.D.   US Pelvis Complete  11/26/2012   *RADIOLOGY REPORT*  Clinical Data: Pelvic pain.  IUD placement.  TRANSABDOMINAL AND TRANSVAGINAL ULTRASOUND OF PELVIS  Technique:  Both transabdominal and transvaginal ultrasound examinations of the pelvis were performed including evaluation of the uterus, ovaries, adnexal regions, and pelvic cul-de-sac.  Comparison: None.  Findings:  Uterus: 8.3 x 4.1 x 5.5 cm.  Normal echotexture.  No focal abnormality.  Endometrium: 6 mm in thickness.  IUD visualized within the endometrium in the region of the lower uterine segment.  Right Ovary: 3.5 x 1.8 x 1.8 cm. Normal size and echotexture.  No adnexal masses.  Left Ovary: 3.5 x 2.7 x 2.6 cm.  There is an echogenic shadowing mass within the left ovary measuring 2.2 x 2.0 x 1.6 cm compatible with dermoid.  Other Findings:  No free fluid.  IMPRESSION: IUD within the lower uterine segment region of the endometrium.  Echogenic, calcified shadowing structure in the left ovary compatible with a dermoid.   Original Report Authenticated By: Charlett Nose, M.D.    MAU COURSE Per consult w/ Dr. Clearance Coots IUD removed w/ ring forceps w/out difficulty. Pt's pain improved soon after.   Depo Provera given prior to D/C.  ASSESSMENT 1. Malpositioned IUD, initial encounter   2. BV (bacterial vaginosis)   3. Dermoid cyst of ovary, left    PLAN Discharge home in stable condition.  Discuss dermoid w/ Dr. Clearance Coots.  Use condoms for back-up birth control.  Follow-up Information   Follow up with HARPER,CHARLES A, MD. (for birth control management or as needed)     Contact information:   [address] [place] [address] [phone]         Medication List    STOP taking these medications       levonorgestrel 20 MCG/24HR IUD  Commonly known as:  MIRENA      TAKE these medications       albuterol 108 (90 BASE) MCG/ACT inhaler  Commonly known as:  PROVENTIL HFA;VENTOLIN HFA  Inhale 1-2 puffs into the lungs every 6 (six) hours as needed for wheezing.     Biotin 10 MG Tabs  Take 1 tablet by mouth daily.     cholecalciferol 1000 UNITS tablet  Commonly known as:  VITAMIN D  Take 1,000 Units by mouth daily.     ketorolac 10 MG tablet  Commonly known as:  TORADOL  Take 1 tablet (10 mg total) by mouth every 6 (six) hours as needed for pain.     metroNIDAZOLE 500 MG tablet  Commonly known as:  FLAGYL  Take 1 tablet (500 mg total) by mouth 2 (two) times daily.     SOLUBLE FIBER/PROBIOTICS PO  Take 2 tablets by mouth daily.  [place], CNM 11/25/2012  1:28 AM

## 2012-11-25 NOTE — MAU Note (Signed)
Patient is in with abdominal pain that she describes as "raw on the inside"/ contractions like and brown vaginal discharge. She have an mirena iud that was placed 2 years ago per patient

## 2012-11-26 DIAGNOSIS — D279 Benign neoplasm of unspecified ovary: Secondary | ICD-10-CM | POA: Diagnosis present

## 2012-11-26 DIAGNOSIS — T8389XA Other specified complication of genitourinary prosthetic devices, implants and grafts, initial encounter: Secondary | ICD-10-CM

## 2012-11-26 LAB — GC/CHLAMYDIA PROBE AMP
CT Probe RNA: NEGATIVE
GC Probe RNA: NEGATIVE

## 2012-11-26 LAB — WET PREP, GENITAL: Yeast Wet Prep HPF POC: NONE SEEN

## 2012-11-26 MED ORDER — METRONIDAZOLE 500 MG PO TABS: 500.0000 mg | ORAL_TABLET | Freq: Two times a day (BID) | ORAL | Status: DC

## 2012-11-26 MED ORDER — KETOROLAC TROMETHAMINE 10 MG PO TABS: 10.0000 mg | ORAL_TABLET | Freq: Four times a day (QID) | ORAL | Status: DC | PRN

## 2012-11-26 MED ORDER — MEDROXYPROGESTERONE ACETATE 150 MG/ML IM SUSP
150.0000 mg | Freq: Once | INTRAMUSCULAR | Status: AC
  Administered 2012-11-26: 150 mg via INTRAMUSCULAR
  Filled 2012-11-26: qty 1

## 2012-11-28 ENCOUNTER — Ambulatory Visit (INDEPENDENT_AMBULATORY_CARE_PROVIDER_SITE_OTHER): Payer: Medicaid Other | Admitting: Obstetrics

## 2012-11-28 VITALS — BP 116/76 | HR 74 | Temp 97.6°F | Wt 153.0 lb

## 2012-11-28 DIAGNOSIS — L309 Dermatitis, unspecified: Secondary | ICD-10-CM

## 2012-11-28 DIAGNOSIS — N83209 Unspecified ovarian cyst, unspecified side: Secondary | ICD-10-CM

## 2012-11-28 DIAGNOSIS — B9689 Other specified bacterial agents as the cause of diseases classified elsewhere: Secondary | ICD-10-CM

## 2012-11-28 DIAGNOSIS — N760 Acute vaginitis: Secondary | ICD-10-CM

## 2012-11-28 DIAGNOSIS — L259 Unspecified contact dermatitis, unspecified cause: Secondary | ICD-10-CM

## 2012-11-28 DIAGNOSIS — A499 Bacterial infection, unspecified: Secondary | ICD-10-CM

## 2012-11-28 MED ORDER — METRONIDAZOLE 500 MG PO TABS: 500.0000 mg | ORAL_TABLET | Freq: Two times a day (BID) | ORAL | Status: DC

## 2012-11-28 NOTE — Progress Notes (Signed)
Subjective:     Tamara Waller is a 32 y.o. female here for hospital follow up.  Current complaints: ovarian cyst.  Personal health questionnaire reviewed: not asked.   Gynecologic History Patient's last menstrual period was 10/20/2012. Contraception: Depo-Provera injections   Obstetric History OB History   Grav Para Term Preterm Abortions TAB SAB Ect Mult Living   3 3 2 1  0 0 0 0 0 3     # Outc Date GA Lbr Len/2nd Wgt Sex Del Anes PTL Lv   1 TRM      SVD   Yes   2 TRM      SVD   Yes   3 PRE  [redacted]w[redacted]d   M SVD   Yes       The following portions of the patient's history were reviewed and updated as appropriate: allergies, current medications, past family history, past medical history, past social history, past surgical history and problem list.  Review of Systems Pertinent items are noted in HPI.    Objective:    No exam performed today, Consult only for U/S results..    Assessment:    Healthy female exam.    Plan:    Education reviewed: Management of small Dermoids.. Contraception: Depo-Provera injections. Follow up in: 4 weeks. Repeat U/S in 6 weeks.

## 2012-12-12 ENCOUNTER — Encounter: Payer: Self-pay | Admitting: Obstetrics & Gynecology

## 2012-12-19 ENCOUNTER — Encounter: Payer: Self-pay | Admitting: Obstetrics

## 2012-12-30 ENCOUNTER — Ambulatory Visit: Payer: Medicaid Other | Admitting: Obstetrics

## 2013-01-29 ENCOUNTER — Ambulatory Visit (INDEPENDENT_AMBULATORY_CARE_PROVIDER_SITE_OTHER): Payer: Medicaid Other | Admitting: Obstetrics

## 2013-01-29 ENCOUNTER — Encounter: Payer: Self-pay | Admitting: Obstetrics

## 2013-01-29 VITALS — BP 127/80 | HR 69 | Temp 98.2°F | Wt 154.2 lb

## 2013-01-29 DIAGNOSIS — B9689 Other specified bacterial agents as the cause of diseases classified elsewhere: Secondary | ICD-10-CM

## 2013-01-29 DIAGNOSIS — N760 Acute vaginitis: Secondary | ICD-10-CM

## 2013-01-29 DIAGNOSIS — A499 Bacterial infection, unspecified: Secondary | ICD-10-CM

## 2013-01-29 DIAGNOSIS — D271 Benign neoplasm of left ovary: Secondary | ICD-10-CM

## 2013-01-29 DIAGNOSIS — B351 Tinea unguium: Secondary | ICD-10-CM

## 2013-01-29 DIAGNOSIS — D279 Benign neoplasm of unspecified ovary: Secondary | ICD-10-CM

## 2013-01-29 DIAGNOSIS — N83209 Unspecified ovarian cyst, unspecified side: Secondary | ICD-10-CM

## 2013-01-29 MED ORDER — MEDROXYPROGESTERONE ACETATE 150 MG/ML IM SUSP: 150.0000 mg | INTRAMUSCULAR | Status: DC

## 2013-01-29 NOTE — Progress Notes (Signed)
.   Subjective:     Tamara Waller is a 32 y.o. female here for a follow up visit.   Current complaints: She is here today for a follow up from her last visit.  She has not had a follow up ultrasound yet.  Patient states that she has an odor to her urine..  Personal health questionnaire reviewed: yes.   Gynecologic History No LMP recorded. Contraception: Depo-Provera injections   Obstetric History OB History  Gravida Para Term Preterm AB SAB TAB Ectopic Multiple Living  3 3 2 1  0 0 0 0 0 3    # Outcome Date GA Lbr Len/2nd Weight Sex Delivery Anes PTL Lv  3 PRE  [redacted]w[redacted]d   M SVD   Y  2 TRM      SVD   Y  1 TRM      SVD   Y       The following portions of the patient's history were reviewed and updated as appropriate: allergies, current medications, past family history, past medical history, past social history, past surgical history and problem list.  Review of Systems Pertinent items are noted in HPI.    Objective:    No exam performed today, Consultation visit..    Assessment:    Small Dermoid Cyst, asymptomatic.   Plan:    Education reviewed: Management of small Dermoid Cysts. Follow up in: 2 weeks. Ultrasound ordered.

## 2013-01-30 LAB — URINE CULTURE: Colony Count: NO GROWTH

## 2013-02-04 ENCOUNTER — Other Ambulatory Visit: Payer: Self-pay | Admitting: Obstetrics

## 2013-02-04 DIAGNOSIS — N83209 Unspecified ovarian cyst, unspecified side: Secondary | ICD-10-CM

## 2013-02-04 DIAGNOSIS — D271 Benign neoplasm of left ovary: Secondary | ICD-10-CM

## 2013-02-11 ENCOUNTER — Other Ambulatory Visit: Payer: Medicaid Other

## 2013-02-16 ENCOUNTER — Encounter: Payer: Self-pay | Admitting: Obstetrics

## 2013-02-17 ENCOUNTER — Ambulatory Visit (INDEPENDENT_AMBULATORY_CARE_PROVIDER_SITE_OTHER): Payer: Medicaid Other | Admitting: *Deleted

## 2013-02-17 ENCOUNTER — Ambulatory Visit: Payer: Self-pay

## 2013-02-17 VITALS — BP 125/77 | HR 93 | Temp 98.7°F | Ht 62.5 in | Wt 153.2 lb

## 2013-02-17 DIAGNOSIS — IMO0001 Reserved for inherently not codable concepts without codable children: Secondary | ICD-10-CM

## 2013-02-17 DIAGNOSIS — Z309 Encounter for contraceptive management, unspecified: Secondary | ICD-10-CM

## 2013-02-17 DIAGNOSIS — B351 Tinea unguium: Secondary | ICD-10-CM

## 2013-02-17 MED ORDER — MEDROXYPROGESTERONE ACETATE 150 MG/ML IM SUSP
150.0000 mg | INTRAMUSCULAR | Status: AC
  Administered 2013-02-17 – 2014-02-11 (×4): 150 mg via INTRAMUSCULAR

## 2013-02-17 NOTE — Progress Notes (Signed)
Patient is here today for her depo injection.  Patient tolerated well and was advised to RTO for next injection 05/11/13.

## 2013-03-03 ENCOUNTER — Other Ambulatory Visit: Payer: Medicaid Other

## 2013-05-11 ENCOUNTER — Ambulatory Visit (INDEPENDENT_AMBULATORY_CARE_PROVIDER_SITE_OTHER): Payer: Medicaid Other | Admitting: *Deleted

## 2013-05-11 VITALS — BP 115/84 | HR 71 | Temp 98.5°F | Ht 62.0 in | Wt 158.0 lb

## 2013-05-11 DIAGNOSIS — IMO0001 Reserved for inherently not codable concepts without codable children: Secondary | ICD-10-CM

## 2013-05-11 DIAGNOSIS — Z309 Encounter for contraceptive management, unspecified: Secondary | ICD-10-CM

## 2013-05-11 NOTE — Progress Notes (Signed)
Pt in office today for depo injection 

## 2013-07-29 ENCOUNTER — Encounter (HOSPITAL_COMMUNITY): Payer: Self-pay | Admitting: Emergency Medicine

## 2013-07-29 ENCOUNTER — Emergency Department (HOSPITAL_COMMUNITY)
Admission: EM | Admit: 2013-07-29 | Discharge: 2013-07-29 | Discharge status: Against Medical Advice | Payer: Medicaid Other | Attending: Emergency Medicine | Admitting: Emergency Medicine

## 2013-07-29 DIAGNOSIS — J45909 Unspecified asthma, uncomplicated: Secondary | ICD-10-CM | POA: Insufficient documentation

## 2013-07-29 DIAGNOSIS — R21 Rash and other nonspecific skin eruption: Secondary | ICD-10-CM | POA: Insufficient documentation

## 2013-07-29 NOTE — ED Notes (Signed)
Pt. Stated, my entire house has scabies, and i need to be treated.

## 2013-07-29 NOTE — ED Notes (Signed)
Pt has a rash on her legs.  She has been exposed to scabies.

## 2013-07-29 NOTE — ED Notes (Signed)
Pt. Stated, i have to leave because my ride is already here . I'll come back tomorrow.

## 2013-08-01 NOTE — ED Provider Notes (Signed)
Patient left ED prior to being seen.     Sherrie George, PA-C 08/01/13 1620

## 2013-08-03 ENCOUNTER — Encounter: Payer: Self-pay | Admitting: Obstetrics

## 2013-08-03 ENCOUNTER — Ambulatory Visit (INDEPENDENT_AMBULATORY_CARE_PROVIDER_SITE_OTHER): Payer: Medicaid Other | Admitting: Obstetrics

## 2013-08-03 ENCOUNTER — Ambulatory Visit: Payer: Medicaid Other

## 2013-08-03 VITALS — BP 122/81 | HR 67 | Temp 98.2°F | Ht 62.5 in | Wt 152.0 lb

## 2013-08-03 DIAGNOSIS — Z3049 Encounter for surveillance of other contraceptives: Secondary | ICD-10-CM

## 2013-08-03 DIAGNOSIS — Z0000 Encounter for general adult medical examination without abnormal findings: Secondary | ICD-10-CM

## 2013-08-03 DIAGNOSIS — Z124 Encounter for screening for malignant neoplasm of cervix: Secondary | ICD-10-CM

## 2013-08-03 DIAGNOSIS — N390 Urinary tract infection, site not specified: Secondary | ICD-10-CM

## 2013-08-03 DIAGNOSIS — Z113 Encounter for screening for infections with a predominantly sexual mode of transmission: Secondary | ICD-10-CM

## 2013-08-03 LAB — POCT URINALYSIS DIPSTICK
Bilirubin, UA: NEGATIVE
GLUCOSE UA: NEGATIVE
Ketones, UA: NEGATIVE
NITRITE UA: POSITIVE
SPEC GRAV UA: 1.025
UROBILINOGEN UA: NEGATIVE
pH, UA: 6

## 2013-08-03 NOTE — Progress Notes (Signed)
Subjective:     Tamara Waller is a 33 y.o. female here for a routine exam.  Current complaints: annual exam and Depo Injection. Patient states she is having heart palpations. Patient states she has not had follow up regarding and ovarian cyst.  Personal health questionnaire reviewed: yes.   Gynecologic History Patient's last menstrual period was 07/30/2013. Contraception: Depo-Provera injections Last Pap: 01/2012. Results were: normal  Obstetric History OB History  Gravida Para Term Preterm AB SAB TAB Ectopic Multiple Living  3 3 2 1  0 0 0 0 0 3    # Outcome Date GA Lbr Len/2nd Weight Sex Delivery Anes PTL Lv  3 PRE  [redacted]w[redacted]d   M SVD   Y  2 TRM      SVD   Y  1 TRM      SVD   Y       The following portions of the patient's history were reviewed and updated as appropriate: allergies, current medications, past family history, past medical history, past social history, past surgical history and problem list.  Review of Systems Pertinent items are noted in HPI.    Objective:    General appearance: alert and no distress    Assessment:    Healthy female exam.    Plan:    Education reviewed: Contraceptive optons. Contraception: Depo-Provera injections. Follow up in: 1 years. Depo Provera Rx

## 2013-08-04 ENCOUNTER — Encounter: Payer: Self-pay | Admitting: Obstetrics

## 2013-08-04 DIAGNOSIS — N390 Urinary tract infection, site not specified: Secondary | ICD-10-CM | POA: Insufficient documentation

## 2013-08-04 LAB — PAP IG W/ RFLX HPV ASCU

## 2013-08-04 LAB — WET PREP BY MOLECULAR PROBE
Candida species: NEGATIVE
Gardnerella vaginalis: POSITIVE — AB
Trichomonas vaginosis: NEGATIVE

## 2013-08-04 LAB — GC/CHLAMYDIA PROBE AMP
CT PROBE, AMP APTIMA: NEGATIVE
GC Probe RNA: NEGATIVE

## 2013-08-04 LAB — URINE CULTURE
COLONY COUNT: NO GROWTH
ORGANISM ID, BACTERIA: NO GROWTH

## 2013-08-20 ENCOUNTER — Encounter: Payer: Self-pay | Admitting: Obstetrics

## 2013-08-20 ENCOUNTER — Other Ambulatory Visit: Payer: Self-pay | Admitting: *Deleted

## 2013-08-20 DIAGNOSIS — N390 Urinary tract infection, site not specified: Secondary | ICD-10-CM

## 2013-08-20 DIAGNOSIS — B9689 Other specified bacterial agents as the cause of diseases classified elsewhere: Secondary | ICD-10-CM

## 2013-08-20 DIAGNOSIS — N760 Acute vaginitis: Principal | ICD-10-CM

## 2013-08-20 MED ORDER — METRONIDAZOLE 500 MG PO TABS: 500.0000 mg | ORAL_TABLET | Freq: Two times a day (BID) | ORAL | Status: DC

## 2013-08-20 MED ORDER — NITROFURANTOIN MONOHYD MACRO 100 MG PO CAPS: 100.0000 mg | ORAL_CAPSULE | Freq: Two times a day (BID) | ORAL | Status: DC

## 2013-08-26 ENCOUNTER — Encounter: Payer: Self-pay | Admitting: Obstetrics & Gynecology

## 2013-08-28 ENCOUNTER — Emergency Department (HOSPITAL_COMMUNITY)
Admission: EM | Admit: 2013-08-28 | Discharge: 2013-08-28 | Disposition: A | Discharge status: Home / Self Care | Payer: Medicaid Other | Attending: Emergency Medicine | Admitting: Emergency Medicine

## 2013-08-28 ENCOUNTER — Emergency Department (HOSPITAL_COMMUNITY): Payer: Medicaid Other

## 2013-08-28 ENCOUNTER — Encounter (HOSPITAL_COMMUNITY): Payer: Self-pay | Admitting: Emergency Medicine

## 2013-08-28 DIAGNOSIS — Z79899 Other long term (current) drug therapy: Secondary | ICD-10-CM | POA: Insufficient documentation

## 2013-08-28 DIAGNOSIS — Z8719 Personal history of other diseases of the digestive system: Secondary | ICD-10-CM | POA: Insufficient documentation

## 2013-08-28 DIAGNOSIS — Z8742 Personal history of other diseases of the female genital tract: Secondary | ICD-10-CM | POA: Insufficient documentation

## 2013-08-28 DIAGNOSIS — Z8619 Personal history of other infectious and parasitic diseases: Secondary | ICD-10-CM | POA: Insufficient documentation

## 2013-08-28 DIAGNOSIS — Z87440 Personal history of urinary (tract) infections: Secondary | ICD-10-CM | POA: Insufficient documentation

## 2013-08-28 DIAGNOSIS — Z9889 Other specified postprocedural states: Secondary | ICD-10-CM | POA: Insufficient documentation

## 2013-08-28 DIAGNOSIS — R079 Chest pain, unspecified: Secondary | ICD-10-CM | POA: Insufficient documentation

## 2013-08-28 DIAGNOSIS — J45909 Unspecified asthma, uncomplicated: Secondary | ICD-10-CM | POA: Insufficient documentation

## 2013-08-28 LAB — CBC
HCT: 39 % (ref 36.0–46.0)
HEMOGLOBIN: 14 g/dL (ref 12.0–15.0)
MCH: 30.8 pg (ref 26.0–34.0)
MCHC: 35.9 g/dL (ref 30.0–36.0)
MCV: 85.9 fL (ref 78.0–100.0)
PLATELETS: 352 10*3/uL (ref 150–400)
RBC: 4.54 MIL/uL (ref 3.87–5.11)
RDW: 12.5 % (ref 11.5–15.5)
WBC: 5.4 10*3/uL (ref 4.0–10.5)

## 2013-08-28 LAB — BASIC METABOLIC PANEL
BUN: 9 mg/dL (ref 6–23)
CALCIUM: 9.4 mg/dL (ref 8.4–10.5)
CO2: 20 mEq/L (ref 19–32)
CREATININE: 0.71 mg/dL (ref 0.50–1.10)
Chloride: 101 mEq/L (ref 96–112)
Glucose, Bld: 143 mg/dL — ABNORMAL HIGH (ref 70–99)
Potassium: 3.8 mEq/L (ref 3.7–5.3)
Sodium: 138 mEq/L (ref 137–147)

## 2013-08-28 LAB — I-STAT TROPONIN, ED: TROPONIN I, POC: 0 ng/mL (ref 0.00–0.08)

## 2013-08-28 MED ORDER — KETOROLAC TROMETHAMINE 60 MG/2ML IM SOLN
60.0000 mg | Freq: Once | INTRAMUSCULAR | Status: AC
  Administered 2013-08-28: 60 mg via INTRAMUSCULAR
  Filled 2013-08-28: qty 2

## 2013-08-28 NOTE — ED Provider Notes (Signed)
CSN: [id number]     Arrival date & time 08/28/13  1058 History   First MD Initiated Contact with Patient 08/28/13 1113     Chief Complaint  Patient presents with  . Chest Pain     (Consider location/radiation/quality/duration/timing/severity/associated sxs/prior Treatment) HPI Comments: Patient is a 33 year old female with no significant past medical history. She presents today with complaints of pressure in the left side of her chest which has been occurring intermittently for the past 4 days. She denies any difficulty breathing, nausea, diaphoresis, or radiation to the arm or jaw. She tells me that it is worse when she laughs, coughs, breathes deep, or moves. She denies any fevers or chills. She denies any productive cough. She has no cardiac risk factors and no prior cardiac history.  Patient is a 33 y.o. female presenting with chest pain. The history is provided by the patient.  Chest Pain Pain location:  L chest Pain quality: pressure   Pain radiates to:  Does not radiate Pain radiates to the back: no   Pain severity:  Moderate Onset quality:  Gradual Duration:  4 days Timing:  Intermittent Progression:  Worsening Chronicity:  New Relieved by:  Nothing Worsened by:  Deep breathing, coughing and movement Ineffective treatments:  None tried   Past Medical History  Diagnosis Date  . Asthma   . GERD (gastroesophageal reflux disease)   . UTI (urinary tract infection)   . Pyelonephritis   . Chlamydia   . Trichomonas    Past Surgical History  Procedure Laterality Date  . Hernia repair    . Cardiac surgery      as newborn   Family History  Problem Relation Age of Onset  . Schizophrenia Mother    History  Substance Use Topics  . Smoking status: Never Smoker   . Smokeless tobacco: Not on file  . Alcohol Use: No   OB History   Grav Para Term Preterm Abortions TAB SAB Ect Mult Living   3 3 2 1  0 0 0 0 0 3     Review of Systems  Cardiovascular: Positive for chest  pain.  All other systems reviewed and are negative.     Allergies  Review of patient's allergies indicates no known allergies.  Home Medications   Current Outpatient Rx  Name  Route  Sig  Dispense  Refill  . albuterol (PROVENTIL HFA;VENTOLIN HFA) 108 (90 BASE) MCG/ACT inhaler   Inhalation   Inhale into the lungs every 6 (six) hours as needed for wheezing or shortness of breath.         . medroxyPROGESTERone (DEPO-PROVERA) 150 MG/ML injection   Intramuscular   Inject 1 mL (150 mg total) into the muscle every 3 (three) months.   1 mL   3   . metroNIDAZOLE (FLAGYL) 500 MG tablet   Oral   Take 1 tablet (500 mg total) by mouth 2 (two) times daily.   14 tablet   0   . nitrofurantoin, macrocrystal-monohydrate, (MACROBID) 100 MG capsule   Oral   Take 1 capsule (100 mg total) by mouth 2 (two) times daily. 1 po BID x 7days   14 capsule   0    BP 120/69  Pulse 99  Temp(Src) 98.2 F (36.8 C) (Oral)  Resp 15  Ht 5\' 2"  (1.575 m)  Wt 155 lb 11.2 oz (70.625 kg)  BMI 28.47 kg/m2  SpO2 97%  LMP 07/30/2013 Physical Exam  Nursing note and vitals reviewed. Constitutional: She is oriented  to person, place, and time. She appears well-developed and well-nourished. No distress.  HENT:  Head: Normocephalic and atraumatic.  Neck: Normal range of motion. Neck supple.  Cardiovascular: Normal rate and regular rhythm.  Exam reveals no gallop and no friction rub.   No murmur heard. Pulmonary/Chest: Effort normal and breath sounds normal. No respiratory distress. She has no wheezes. She exhibits tenderness.  There is tenderness to palpation of the left upper anterior chest wall which reproduces her symptoms.  Abdominal: Soft. Bowel sounds are normal. She exhibits no distension. There is no tenderness.  Musculoskeletal: Normal range of motion.  Neurological: She is alert and oriented to person, place, and time.  Skin: Skin is warm and dry. She is not diaphoretic.    ED Course   Procedures (including critical care time) Labs Review Labs Reviewed  [place], ED   Imaging Review No results found.   EKG Interpretation   Date/Time:  Friday August 28 2013 11:03:33 EDT Ventricular Rate:  87 PR Interval:  142 QRS Duration: 70 QT Interval:  344 QTC Calculation: 413 R Axis:   69 Text Interpretation:  Normal sinus rhythm with sinus arrhythmia  Nonspecific T wave abnormality Abnormal ECG Confirmed by DELOS  MD,  Jaylean Buenaventura ([id number]) on 08/28/2013 11:14:20 AM      MDM   Final diagnoses:  None    Patient is a 33 year old female with no cardiac risk factors who presents with atypical chest pain. She is tender to palpation over her left anterior chest wall which reproduces her symptoms and cardiac workup is negative with symptoms ongoing for 4 days. At this point I feel as though she is appropriate for discharge. I believe her symptoms are musculoskeletal in nature. Will treat with anti-inflammatories and when necessary followup.    Veryl Speak, MD 08/28/13 [phone]

## 2013-08-28 NOTE — ED Notes (Signed)
She said for past 4 days, every time she laughs/moves/breathes it hurts. She denies any SOB or nausea. She is A&Ox4, breathing easily

## 2013-08-28 NOTE — ED Notes (Signed)
Patient discharged to home with family. NAD.  

## 2013-08-28 NOTE — Discharge Instructions (Signed)
Ibuprofen 600 mg 3 times daily for the next 5 days.  Return to the emergency department if her pain worsens, you develop difficulty breathing, high fever, or other new and concerning symptoms.   Chest Pain (Nonspecific) It is often hard to give a specific diagnosis for the cause of chest pain. There is always a chance that your pain could be related to something serious, such as a heart attack or a blood clot in the lungs. You need to follow up with your caregiver for further evaluation. CAUSES   Heartburn.  Pneumonia or bronchitis.  Anxiety or stress.  Inflammation around your heart (pericarditis) or lung (pleuritis or pleurisy).  A blood clot in the lung.  A collapsed lung (pneumothorax). It can develop suddenly on its own (spontaneous pneumothorax) or from injury (trauma) to the chest.  Shingles infection (herpes zoster virus). The chest wall is composed of bones, muscles, and cartilage. Any of these can be the source of the pain.  The bones can be bruised by injury.  The muscles or cartilage can be strained by coughing or overwork.  The cartilage can be affected by inflammation and become sore (costochondritis). DIAGNOSIS  Lab tests or other studies, such as X-rays, electrocardiography, stress testing, or cardiac imaging, may be needed to find the cause of your pain.  TREATMENT   Treatment depends on what may be causing your chest pain. Treatment may include:  Acid blockers for heartburn.  Anti-inflammatory medicine.  Pain medicine for inflammatory conditions.  Antibiotics if an infection is present.  You may be advised to change lifestyle habits. This includes stopping smoking and avoiding alcohol, caffeine, and chocolate.  You may be advised to keep your head raised (elevated) when sleeping. This reduces the chance of acid going backward from your stomach into your esophagus.  Most of the time, nonspecific chest pain will improve within 2 to 3 days with rest and  mild pain medicine. HOME CARE INSTRUCTIONS   If antibiotics were prescribed, take your antibiotics as directed. Finish them even if you start to feel better.  For the next few days, avoid physical activities that bring on chest pain. Continue physical activities as directed.  Do not smoke.  Avoid drinking alcohol.  Only take over-the-counter or prescription medicine for pain, discomfort, or fever as directed by your caregiver.  Follow your caregiver's suggestions for further testing if your chest pain does not go away.  Keep any follow-up appointments you made. If you do not go to an appointment, you could develop lasting (chronic) problems with pain. If there is any problem keeping an appointment, you must call to reschedule. SEEK MEDICAL CARE IF:   You think you are having problems from the medicine you are taking. Read your medicine instructions carefully.  Your chest pain does not go away, even after treatment.  You develop a rash with blisters on your chest. SEEK IMMEDIATE MEDICAL CARE IF:   You have increased chest pain or pain that spreads to your arm, neck, jaw, back, or abdomen.  You develop shortness of breath, an increasing cough, or you are coughing up blood.  You have severe back or abdominal pain, feel nauseous, or vomit.  You develop severe weakness, fainting, or chills.  You have a fever. THIS IS AN EMERGENCY. Do not wait to see if the pain will go away. Get medical help at once. Call your local emergency services (911 in [place] Do not drive yourself to the hospital. MAKE SURE YOU:   Understand these  instructions.  Will watch your condition.  Will get help right away if you are not doing well or get worse. Document Released: 02/14/2005 Document Revised: 07/30/2011 Document Reviewed: 12/11/2007 [facility] Patient Information 2014 [place].

## 2013-08-28 NOTE — ED Notes (Signed)
Dr. Delo at bedside. 

## 2013-08-29 ENCOUNTER — Encounter (HOSPITAL_COMMUNITY): Payer: Self-pay | Admitting: Emergency Medicine

## 2013-08-29 ENCOUNTER — Emergency Department (HOSPITAL_COMMUNITY)
Admission: EM | Admit: 2013-08-29 | Discharge: 2013-08-29 | Disposition: A | Discharge status: Home / Self Care | Payer: Medicaid Other | Attending: Emergency Medicine | Admitting: Emergency Medicine

## 2013-08-29 DIAGNOSIS — Z79899 Other long term (current) drug therapy: Secondary | ICD-10-CM | POA: Insufficient documentation

## 2013-08-29 DIAGNOSIS — Z8619 Personal history of other infectious and parasitic diseases: Secondary | ICD-10-CM | POA: Insufficient documentation

## 2013-08-29 DIAGNOSIS — R0789 Other chest pain: Secondary | ICD-10-CM | POA: Insufficient documentation

## 2013-08-29 DIAGNOSIS — J45909 Unspecified asthma, uncomplicated: Secondary | ICD-10-CM | POA: Insufficient documentation

## 2013-08-29 DIAGNOSIS — Z87448 Personal history of other diseases of urinary system: Secondary | ICD-10-CM | POA: Insufficient documentation

## 2013-08-29 DIAGNOSIS — Z8719 Personal history of other diseases of the digestive system: Secondary | ICD-10-CM | POA: Insufficient documentation

## 2013-08-29 DIAGNOSIS — Z87440 Personal history of urinary (tract) infections: Secondary | ICD-10-CM | POA: Insufficient documentation

## 2013-08-29 MED ORDER — OXYCODONE-ACETAMINOPHEN 5-325 MG PO TABS
2.0000 | ORAL_TABLET | Freq: Once | ORAL | Status: AC
  Administered 2013-08-29: 2 via ORAL
  Filled 2013-08-29: qty 2

## 2013-08-29 MED ORDER — GI COCKTAIL ~~LOC~~
30.0000 mL | Freq: Once | ORAL | Status: AC
  Administered 2013-08-29: 30 mL via ORAL
  Filled 2013-08-29: qty 30

## 2013-08-29 MED ORDER — PANTOPRAZOLE SODIUM 20 MG PO TBEC: 20.0000 mg | DELAYED_RELEASE_TABLET | Freq: Every day | ORAL | Status: DC

## 2013-08-29 MED ORDER — ONDANSETRON 4 MG PO TBDP
4.0000 mg | ORAL_TABLET | Freq: Once | ORAL | Status: AC
  Administered 2013-08-29: 4 mg via ORAL

## 2013-08-29 MED ORDER — ONDANSETRON 4 MG PO TBDP
ORAL_TABLET | ORAL | Status: AC
  Filled 2013-08-29: qty 1

## 2013-08-29 NOTE — ED Notes (Signed)
Dr. Cheri Guppy made aware of patients pain level, 10/10. Verbal order for 10mg  percocet PO.

## 2013-08-29 NOTE — ED Notes (Signed)
The pt was seen here earlier today with the same chest pain lt upper she has had since Wednesday.  She had lab work ekg and chest xray earlier today.  lmpnone depo

## 2013-08-29 NOTE — ED Provider Notes (Signed)
CSN: [id number]     Arrival date & time 08/29/13  0227 History   First MD Initiated Contact with Patient 08/29/13 0259     Chief Complaint  Patient presents with  . Chest Pain     (Consider location/radiation/quality/duration/timing/severity/associated sxs/prior Treatment) HPI  Patient is a 33 yo woman who is here with complaints of burning left sided chest pain and a feeling that something is expanding in her left chest. She denies cough, sob, nausea, vomiting, abdominal pain. She was seen by Dr. Stark Jock for these sx and discharged about 15 hrs ago. She says her discharge instructions said to come back if her sx recurred. So, she returns.   Discomfort is intermittent. Seems to be worse after po intake. No sob, cough, fever.   Pain is moderately severe at worst. Patient was pain free during my exam.   Chart review shows that the patient had an EKG, CXR, CBC and BMP yesterday which were all wnl.   Past Medical History  Diagnosis Date  . Asthma   . GERD (gastroesophageal reflux disease)   . UTI (urinary tract infection)   . Pyelonephritis   . Chlamydia   . Trichomonas    Past Surgical History  Procedure Laterality Date  . Hernia repair    . Cardiac surgery      as newborn   Family History  Problem Relation Age of Onset  . Schizophrenia Mother    History  Substance Use Topics  . Smoking status: Never Smoker   . Smokeless tobacco: Not on file  . Alcohol Use: No   OB History   Grav Para Term Preterm Abortions TAB SAB Ect Mult Living   3 3 2 1  0 0 0 0 0 3     Review of Systems Ten point review of symptoms performed and is negative with the exception of symptoms noted above.     Allergies  Review of patient's allergies indicates no known allergies.  Home Medications   Current Outpatient Rx  Name  Route  Sig  Dispense  Refill  . albuterol (PROVENTIL HFA;VENTOLIN HFA) 108 (90 BASE) MCG/ACT inhaler   Inhalation   Inhale into the lungs every 6 (six) hours as needed  for wheezing or shortness of breath.         . medroxyPROGESTERone (DEPO-PROVERA) 150 MG/ML injection   Intramuscular   Inject 1 mL (150 mg total) into the muscle every 3 (three) months.   1 mL   3   . metroNIDAZOLE (FLAGYL) 500 MG tablet   Oral   Take 1 tablet (500 mg total) by mouth 2 (two) times daily.   14 tablet   0   . nitrofurantoin, macrocrystal-monohydrate, (MACROBID) 100 MG capsule   Oral   Take 1 capsule (100 mg total) by mouth 2 (two) times daily. 1 po BID x 7days   14 capsule   0    BP 115/75  Pulse 85  Temp(Src) 98.1 F (36.7 C) (Oral)  Resp 22  Ht 5\' 2"  (1.575 m)  Wt 159 lb (72.122 kg)  BMI 29.07 kg/m2  SpO2 100%  LMP 07/30/2013 Physical Exam Gen: well developed and well nourished appearing Head: NCAT Eyes: PERL, EOMI Nose: no epistaixis or rhinorrhea Mouth/throat: mucosa is moist and pink Neck: supple, no stridor Lungs: CTA B, no wheezing, rhonchi or rales CV: RRR, no murmur, extremities appear well perfused.  Abd: soft, notender, nondistended Back: no ttp, no cva ttp Skin: warm and dry Ext: normal to inspection,  no dependent edema Neuro: CN ii-xii grossly intact, no focal deficits Psyche; normal affect,  calm and cooperative.  ED Course  Procedures (including critical care time) Labs Review  Dg Chest 2 View  08/28/2013   CLINICAL DATA:  Left chest pain  EXAM: CHEST  2 VIEW  COMPARISON:  None.  FINDINGS: The heart size and mediastinal contours are within normal limits. Both lungs are clear. The visualized skeletal structures are unremarkable.  IMPRESSION: No active cardiopulmonary disease.   Electronically Signed   By: Margaree Mackintosh M.D.   On: 08/28/2013 11:57     MDM   Persistent atypical chest pain. Sx are most consistent with GERD. We will give GI cocktail as patient's sx have recurred. Anticipate d/c home with script for PPI and outpatient f/u.     Elyn Peers, MD 08/29/13 [phone]

## 2013-08-29 NOTE — ED Notes (Signed)
Pt A&Ox4, ambulatory at discharge, verbalizing no complaints at this time. 

## 2013-10-14 ENCOUNTER — Encounter: Payer: Self-pay | Admitting: Obstetrics

## 2013-10-14 ENCOUNTER — Ambulatory Visit (INDEPENDENT_AMBULATORY_CARE_PROVIDER_SITE_OTHER): Payer: Medicaid Other | Admitting: Obstetrics

## 2013-10-14 VITALS — BP 120/74 | HR 91 | Temp 98.3°F | Wt 155.0 lb

## 2013-10-14 DIAGNOSIS — N760 Acute vaginitis: Secondary | ICD-10-CM

## 2013-10-14 DIAGNOSIS — N83209 Unspecified ovarian cyst, unspecified side: Secondary | ICD-10-CM

## 2013-10-14 DIAGNOSIS — R3989 Other symptoms and signs involving the genitourinary system: Secondary | ICD-10-CM

## 2013-10-14 DIAGNOSIS — K5909 Other constipation: Secondary | ICD-10-CM | POA: Insufficient documentation

## 2013-10-14 DIAGNOSIS — Z113 Encounter for screening for infections with a predominantly sexual mode of transmission: Secondary | ICD-10-CM

## 2013-10-14 DIAGNOSIS — K5900 Constipation, unspecified: Secondary | ICD-10-CM | POA: Insufficient documentation

## 2013-10-14 DIAGNOSIS — R399 Unspecified symptoms and signs involving the genitourinary system: Secondary | ICD-10-CM | POA: Insufficient documentation

## 2013-10-14 LAB — POCT URINALYSIS DIPSTICK
Bilirubin, UA: NEGATIVE
Blood, UA: NEGATIVE
Glucose, UA: NEGATIVE
Ketones, UA: NEGATIVE
Nitrite, UA: NEGATIVE
Protein, UA: NEGATIVE
SPEC GRAV UA: 1.02
UROBILINOGEN UA: NEGATIVE
pH, UA: 5

## 2013-10-14 MED ORDER — DOCUSATE SODIUM 100 MG PO CAPS: 100.0000 mg | ORAL_CAPSULE | Freq: Every day | ORAL | Status: DC | PRN

## 2013-10-14 NOTE — Progress Notes (Signed)
Patient ID: Tamara Waller, female   DOB: 14-Jun-1980, 33 y.o.   MRN: [id number]  Chief Complaint  Patient presents with  . Personal Problem    ovarian cyst check, possible uti/bv    HPI Tamara Waller is a 33 y.o. female.  H/O of small 1-2 cm ovarian dermoid appearing cyst on ultrasound in July 2014, and did not return for F/U.  Also c/o of malodorous vaginal discharge and urinary frequency.  HPI  Past Medical History  Diagnosis Date  . Asthma   . GERD (gastroesophageal reflux disease)   . UTI (urinary tract infection)   . Pyelonephritis   . Chlamydia   . Trichomonas     Past Surgical History  Procedure Laterality Date  . Hernia repair    . Cardiac surgery      as newborn    Family History  Problem Relation Age of Onset  . Schizophrenia Mother     Social History History  Substance Use Topics  . Smoking status: Never Smoker   . Smokeless tobacco: Not on file  . Alcohol Use: No    No Known Allergies  Current Outpatient Prescriptions  Medication Sig Dispense Refill  . medroxyPROGESTERone (DEPO-PROVERA) 150 MG/ML injection Inject 1 mL (150 mg total) into the muscle every 3 (three) months.  1 mL  3  . albuterol (PROVENTIL HFA;VENTOLIN HFA) 108 (90 BASE) MCG/ACT inhaler Inhale into the lungs every 6 (six) hours as needed for wheezing or shortness of breath.      . docusate sodium (COLACE) 100 MG capsule Take 1 capsule (100 mg total) by mouth daily as needed for mild constipation.  24 capsule  prn  . pantoprazole (PROTONIX) 20 MG tablet Take 1 tablet (20 mg total) by mouth daily.  30 tablet  0   Current Facility-Administered Medications  Medication Dose Route Frequency Provider Last Rate Last Dose  . medroxyPROGESTERone (DEPO-PROVERA) injection 150 mg  150 mg Intramuscular Q90 days [name], MD   150 mg at 08/03/13 1125    Review of Systems Review of Systems Constitutional: negative for fatigue and weight loss Respiratory: negative for cough and  wheezing Cardiovascular: negative for chest pain, fatigue and palpitations Gastrointestinal: negative for abdominal pain and change in bowel habits Genitourinary:negative Integument/breast: negative for nipple discharge Musculoskeletal:negative for myalgias Neurological: negative for gait problems and tremors Behavioral/Psych: negative for abusive relationship, depression Endocrine: negative for temperature intolerance     Blood pressure 120/74, pulse 91, temperature 98.3 F (36.8 C), weight 155 lb (70.308 kg).  Physical Exam Physical Exam General:   alert  Skin:   no rash or abnormalities  Lungs:   clear to auscultation bilaterally  Heart:   regular rate and rhythm, S1, S2 normal, no murmur, click, rub or gallop  Breasts:   normal without suspicious masses, skin or nipple changes or axillary nodes  Abdomen:  normal findings: no organomegaly, soft, non-tender and no hernia  Pelvis:  External genitalia: normal general appearance Urinary system: urethral meatus normal and bladder without fullness, nontender Vaginal: normal without tenderness, induration or masses Cervix: normal appearance Adnexa: normal bimanual exam Uterus: anteverted and non-tender, normal size      Data Reviewed Labs Ultrasound  Assessment    H/O small ovarian cyst, with Dermoid features last year.  Asymptomatic. R/O BV. R/O UTI.     Plan    Ultrasound ordered. Wet prep sent for Affirm testing. Urine Dip negative for UTI.  Orders Placed This Encounter  Procedures  .  WET PREP BY MOLECULAR PROBE  . GC/Chlamydia Probe Amp  . US Pelvis Complete    Standing Status: Future     Number of Occurrences:      Standing Expiration Date: 12/15/2014    Order Specific Question:  Reason for Exam (SYMPTOM  OR DIAGNOSIS REQUIRED)    Answer:  H/O ovarian cyst    Order Specific Question:  Preferred imaging location?    Answer:  Internal  . US Transvaginal Non-OB    Standing Status: Future     Number of  Occurrences:      Standing Expiration Date: 12/15/2014    Order Specific Question:  Reason for Exam (SYMPTOM  OR DIAGNOSIS REQUIRED)    Answer:  ovarian cyst    Order Specific Question:  Preferred imaging location?    Answer:  Internal  . HIV antibody  . Hepatitis B surface antigen  . RPR  . Hepatitis C antibody  . POCT urinalysis dipstick   Meds ordered this encounter  Medications  . docusate sodium (COLACE) 100 MG capsule    Sig: Take 1 capsule (100 mg total) by mouth daily as needed for mild constipation.    Dispense:  24 capsule    Refill:  prn        [name] 10/14/2013, 1:41 PM

## 2013-10-15 LAB — WET PREP BY MOLECULAR PROBE
Candida species: POSITIVE — AB
Gardnerella vaginalis: NEGATIVE
TRICHOMONAS VAG: NEGATIVE

## 2013-10-15 LAB — RPR

## 2013-10-15 LAB — HEPATITIS B SURFACE ANTIGEN: Hepatitis B Surface Ag: NEGATIVE

## 2013-10-15 LAB — HIV ANTIBODY (ROUTINE TESTING W REFLEX): HIV 1&2 Ab, 4th Generation: NONREACTIVE

## 2013-10-15 LAB — HEPATITIS C ANTIBODY: HCV AB: NEGATIVE

## 2013-10-16 LAB — GC/CHLAMYDIA PROBE AMP
CT Probe RNA: NEGATIVE
GC PROBE AMP APTIMA: NEGATIVE

## 2013-10-21 ENCOUNTER — Encounter: Payer: Self-pay | Admitting: *Deleted

## 2013-10-21 ENCOUNTER — Ambulatory Visit (HOSPITAL_COMMUNITY): Payer: Medicaid Other

## 2013-10-23 ENCOUNTER — Other Ambulatory Visit: Payer: Self-pay | Admitting: *Deleted

## 2013-10-23 DIAGNOSIS — B379 Candidiasis, unspecified: Secondary | ICD-10-CM

## 2013-10-23 MED ORDER — FLUCONAZOLE 150 MG PO TABS: 150.0000 mg | ORAL_TABLET | Freq: Once | ORAL | Status: DC

## 2013-11-03 ENCOUNTER — Ambulatory Visit (HOSPITAL_COMMUNITY): Admission: RE | Admit: 2013-11-03 | Payer: Medicaid Other | Source: Ambulatory Visit

## 2013-11-17 ENCOUNTER — Telehealth: Payer: Self-pay | Admitting: *Deleted

## 2013-11-17 NOTE — Telephone Encounter (Signed)
Patient is calling to see if she is due her Depo injection.  Patient is over due- 10/25/2013 . Patient has not been sexually active in almost 2 years and started spotting today. Per Dr Jodi Mourning- OK to do UPT and give injection tomorrow. Patient scheduled for appointment.

## 2013-11-18 ENCOUNTER — Ambulatory Visit (INDEPENDENT_AMBULATORY_CARE_PROVIDER_SITE_OTHER): Payer: Medicaid Other | Admitting: *Deleted

## 2013-11-18 VITALS — BP 113/77 | HR 76 | Temp 98.1°F | Ht 62.5 in | Wt 158.0 lb

## 2013-11-18 DIAGNOSIS — Z3042 Encounter for surveillance of injectable contraceptive: Secondary | ICD-10-CM

## 2013-11-18 DIAGNOSIS — Z3049 Encounter for surveillance of other contraceptives: Secondary | ICD-10-CM

## 2013-11-18 DIAGNOSIS — Z3202 Encounter for pregnancy test, result negative: Secondary | ICD-10-CM

## 2013-11-18 LAB — POCT URINE PREGNANCY: PREG TEST UR: NEGATIVE

## 2013-11-18 NOTE — Progress Notes (Signed)
Patient is in the office today for her DEPO Injection. Patient is late for her DEPO Injection. Patient states the last time she had sexual intercourse was early June. Patient states she is currently spotting. UPT preformed, results were negative. I verified with patient about the last time she had sex and that she was currently spotting and let her know that it is very important that there is no chance she is pregnant before we give her injection. Per Protocol Injection given in Left Upper Outer Quadrant. Patient tolerated well. Patient advised to come back on February 09, 2014 for her next DEPO Injection. Patient voiced understanding and scheduled an appointment with the front. Patient asked about lab results. Patient notified of results and that she had Rx waiting for her at her pharmacy. I called the Pharmacy in front of the patient to verify that the medication was there. Hand out given on yeast infections. Patient asked about rescheduling her U/S. I had patient speak with Raford Pitcher and got her U/S rescheduled. Patient notified that after she had her U/S she would need to come in for a F/U visit.   BP 113/77  Pulse 76  Temp(Src) 98.1 F (36.7 C)  Ht 5' 2.5" (1.588 m)  Wt 158 lb (71.668 kg)  BMI 28.42 kg/m2  Administrations This Visit   medroxyPROGESTERone (DEPO-PROVERA) injection 150 mg   Administered Action Dose Route Administered By   11/18/2013 Given 150 mg Intramuscular Ladona Ridgel, LPN          Results for orders placed in visit on 11/18/13 (from the past 24 hour(s))  POCT URINE PREGNANCY     Status: None   Collection Time    11/18/13  5:37 PM      Result Value Ref Range   Preg Test, Ur Negative

## 2013-11-19 ENCOUNTER — Ambulatory Visit (HOSPITAL_COMMUNITY): Payer: Medicaid Other | Attending: Obstetrics

## 2014-02-09 ENCOUNTER — Ambulatory Visit: Payer: Medicaid Other

## 2014-02-11 ENCOUNTER — Ambulatory Visit (INDEPENDENT_AMBULATORY_CARE_PROVIDER_SITE_OTHER): Payer: Medicaid Other | Admitting: Obstetrics

## 2014-02-11 VITALS — BP 120/78 | HR 76 | Temp 97.8°F | Wt 157.0 lb

## 2014-02-11 DIAGNOSIS — D271 Benign neoplasm of left ovary: Secondary | ICD-10-CM

## 2014-02-11 DIAGNOSIS — Z3049 Encounter for surveillance of other contraceptives: Secondary | ICD-10-CM

## 2014-02-11 DIAGNOSIS — D279 Benign neoplasm of unspecified ovary: Secondary | ICD-10-CM

## 2014-02-11 DIAGNOSIS — Z3042 Encounter for surveillance of injectable contraceptive: Secondary | ICD-10-CM

## 2014-02-11 NOTE — Progress Notes (Signed)
Pt is in office today for Depo injection and follow up of dermoid cyst. No complaints.  A/P:  Small 2cm asymptomatic Dermoid left ovary.  Have been following with yearly ultrasounds.  Patient will schedule ultrasound.

## 2014-02-11 NOTE — Progress Notes (Signed)
Pt in office for Depo injection.  Pt is on time for her injection.  Pt tolerated injection well.  Pt advised to return to office on 05-05-14 for next injection. Pt request to see Dr Jodi Mourning at today visit to discuss cyst.  Pt to see Dr Jodi Mourning today.   BP 120/78  Pulse 76  Temp(Src) 97.8 F (36.6 C)  Wt 157 lb (71.215 kg)  Administrations This Visit   medroxyPROGESTERone (DEPO-PROVERA) injection 150 mg   Administered Action Dose Route Administered By   02/11/2014 Given 150 mg Intramuscular Valene Bors, CMA

## 2014-05-05 ENCOUNTER — Other Ambulatory Visit: Payer: Self-pay | Admitting: Obstetrics

## 2014-05-05 ENCOUNTER — Ambulatory Visit (HOSPITAL_COMMUNITY)
Admission: RE | Admit: 2014-05-05 | Discharge: 2014-05-05 | Disposition: A | Discharge status: Home / Self Care | Payer: Medicaid Other | Source: Ambulatory Visit | Attending: Obstetrics | Admitting: Obstetrics

## 2014-05-05 ENCOUNTER — Ambulatory Visit: Payer: Medicaid Other

## 2014-05-05 DIAGNOSIS — D271 Benign neoplasm of left ovary: Secondary | ICD-10-CM | POA: Diagnosis not present

## 2014-05-06 ENCOUNTER — Ambulatory Visit (INDEPENDENT_AMBULATORY_CARE_PROVIDER_SITE_OTHER): Payer: Medicaid Other | Admitting: *Deleted

## 2014-05-06 VITALS — BP 119/75 | HR 83 | Temp 97.3°F | Ht 62.5 in | Wt 154.0 lb

## 2014-05-06 DIAGNOSIS — Z3042 Encounter for surveillance of injectable contraceptive: Secondary | ICD-10-CM

## 2014-05-06 MED ORDER — MEDROXYPROGESTERONE ACETATE 150 MG/ML IM SUSP
150.0000 mg | INTRAMUSCULAR | Status: AC
  Administered 2014-05-06 – 2015-04-13 (×3): 150 mg via INTRAMUSCULAR

## 2014-05-06 NOTE — Progress Notes (Signed)
Patient in office for a Depo Injection and is on time for her injection.  Patient tolerated injection well.  Due for next injection on July 29, 2014  BP 119/75 mmHg  Pulse 83  Temp(Src) 97.3 F (36.3 C)  Ht 5' 2.5" (1.588 m)  Wt 154 lb (69.854 kg)  BMI 27.70 kg/m2  Administrations This Visit    medroxyPROGESTERone (DEPO-PROVERA) injection 150 mg    Administered Action Dose Route Administered By         05/06/2014 Given 150 mg Intramuscular Sherrin Daisy, LPN

## 2014-05-17 ENCOUNTER — Encounter: Payer: Self-pay | Admitting: *Deleted

## 2014-05-18 ENCOUNTER — Encounter: Payer: Self-pay | Admitting: Obstetrics & Gynecology

## 2014-07-29 ENCOUNTER — Ambulatory Visit: Payer: Medicaid Other

## 2014-08-23 ENCOUNTER — Ambulatory Visit: Payer: Medicaid Other | Admitting: Obstetrics

## 2014-12-28 ENCOUNTER — Ambulatory Visit: Payer: Medicaid Other | Admitting: Obstetrics

## 2015-01-06 ENCOUNTER — Other Ambulatory Visit (INDEPENDENT_AMBULATORY_CARE_PROVIDER_SITE_OTHER): Payer: Medicaid Other

## 2015-01-06 VITALS — BP 118/78 | HR 67 | Temp 97.5°F | Wt 156.3 lb

## 2015-01-06 DIAGNOSIS — Z30013 Encounter for initial prescription of injectable contraceptive: Secondary | ICD-10-CM | POA: Diagnosis not present

## 2015-01-06 LAB — POCT URINE PREGNANCY
PREG TEST UR: NEGATIVE
Preg Test, Ur: NEGATIVE

## 2015-01-20 ENCOUNTER — Ambulatory Visit (INDEPENDENT_AMBULATORY_CARE_PROVIDER_SITE_OTHER): Payer: Medicaid Other | Admitting: Obstetrics

## 2015-01-20 ENCOUNTER — Encounter: Payer: Self-pay | Admitting: Obstetrics

## 2015-01-20 ENCOUNTER — Ambulatory Visit (INDEPENDENT_AMBULATORY_CARE_PROVIDER_SITE_OTHER): Payer: Medicaid Other | Admitting: *Deleted

## 2015-01-20 VITALS — BP 116/76 | HR 81 | Temp 98.7°F | Wt 155.3 lb

## 2015-01-20 DIAGNOSIS — Z3202 Encounter for pregnancy test, result negative: Secondary | ICD-10-CM | POA: Diagnosis not present

## 2015-01-20 DIAGNOSIS — Z3042 Encounter for surveillance of injectable contraceptive: Secondary | ICD-10-CM

## 2015-01-20 DIAGNOSIS — Z0000 Encounter for general adult medical examination without abnormal findings: Secondary | ICD-10-CM

## 2015-01-20 DIAGNOSIS — Z113 Encounter for screening for infections with a predominantly sexual mode of transmission: Secondary | ICD-10-CM

## 2015-01-20 DIAGNOSIS — Z01419 Encounter for gynecological examination (general) (routine) without abnormal findings: Secondary | ICD-10-CM

## 2015-01-20 LAB — CBC
HCT: 43.2 % (ref 36.0–46.0)
HEMOGLOBIN: 15 g/dL (ref 12.0–15.0)
MCH: 29.9 pg (ref 26.0–34.0)
MCHC: 34.7 g/dL (ref 30.0–36.0)
MCV: 86.2 fL (ref 78.0–100.0)
MPV: 10.2 fL (ref 8.6–12.4)
Platelets: 389 10*3/uL (ref 150–400)
RBC: 5.01 MIL/uL (ref 3.87–5.11)
RDW: 12.8 % (ref 11.5–15.5)
WBC: 7.6 10*3/uL (ref 4.0–10.5)

## 2015-01-20 LAB — COMPREHENSIVE METABOLIC PANEL
ALT: 40 U/L — ABNORMAL HIGH (ref 6–29)
AST: 29 U/L (ref 10–30)
Albumin: 4.2 g/dL (ref 3.6–5.1)
Alkaline Phosphatase: 76 U/L (ref 33–115)
BILIRUBIN TOTAL: 0.5 mg/dL (ref 0.2–1.2)
BUN: 11 mg/dL (ref 7–25)
CALCIUM: 9.6 mg/dL (ref 8.6–10.2)
CO2: 25 mmol/L (ref 20–31)
Chloride: 102 mmol/L (ref 98–110)
Creat: 0.77 mg/dL (ref 0.50–1.10)
Glucose, Bld: 63 mg/dL — ABNORMAL LOW (ref 65–99)
POTASSIUM: 4.1 mmol/L (ref 3.5–5.3)
Sodium: 137 mmol/L (ref 135–146)
Total Protein: 7.3 g/dL (ref 6.1–8.1)

## 2015-01-20 LAB — POCT URINE PREGNANCY: Preg Test, Ur: NEGATIVE

## 2015-01-20 LAB — TSH: TSH: 1.01 u[IU]/mL (ref 0.350–4.500)

## 2015-01-20 MED ORDER — MEDROXYPROGESTERONE ACETATE 150 MG/ML IM SUSP: 150.0000 mg | INTRAMUSCULAR | Status: DC

## 2015-01-20 NOTE — Progress Notes (Signed)
Pt. Was seen in the office today to receive her injection. Pt. Took injection in the rt. Side with no problems. Pt. Next injection due 04/13/15.  Injection was prepared and given by Chandra Batch.  Lot# [id number] Exp. 04/2019

## 2015-01-20 NOTE — Progress Notes (Signed)
Subjective:        Tamara Waller is a 34 y.o. female here for a routine exam.  Current complaints: none.    Personal health questionnaire:  Is patient Ashkenazi Jewish, have a family history of breast and/or ovarian cancer: no Is there a family history of uterine cancer diagnosed at age < 32, gastrointestinal cancer, urinary tract cancer, family member who is a Field seismologist syndrome-associated carrier: no Is the patient overweight and hypertensive, family history of diabetes, personal history of gestational diabetes, preeclampsia or PCOS: no Is patient over 59, have PCOS,  family history of premature CHD under age 5, diabetes, smoke, have hypertension or peripheral artery disease:  no At any time, has a partner hit, kicked or otherwise hurt or frightened you?: no Over the past 2 weeks, have you felt down, depressed or hopeless?: no Over the past 2 weeks, have you felt little interest or pleasure in doing things?:no   Gynecologic History Patient's last menstrual period was 04/24/2014 (approximate). Contraception: none Last Pap: 2015. Results were: normal Last mammogram: n/a. Results were: n/a  Obstetric History OB History  Gravida Para Term Preterm AB SAB TAB Ectopic Multiple Living  3 3 2 1  0 0 0 0 0 3    # Outcome Date GA Lbr Len/2nd Weight Sex Delivery Anes PTL Lv  3 Preterm  [redacted]w[redacted]d   M Vag-Spont   Y  2 Term      Vag-Spont   Y  1 Term      Vag-Spont   Y      Past Medical History  Diagnosis Date  . Asthma   . GERD (gastroesophageal reflux disease)   . UTI (urinary tract infection)   . Pyelonephritis   . Chlamydia   . Trichomonas     Past Surgical History  Procedure Laterality Date  . Hernia repair    . Cardiac surgery      as newborn     Current outpatient prescriptions:  .  albuterol (PROVENTIL HFA;VENTOLIN HFA) 108 (90 BASE) MCG/ACT inhaler, Inhale into the lungs every 6 (six) hours as needed for wheezing or shortness of breath., Disp: , Rfl:  .   medroxyPROGESTERone (DEPO-PROVERA) 150 MG/ML injection, Inject 1 mL (150 mg total) into the muscle every 3 (three) months., Disp: 1 mL, Rfl: 3 .  pantoprazole (PROTONIX) 20 MG tablet, Take 1 tablet (20 mg total) by mouth daily. (Patient not taking: Reported on 05/06/2014), Disp: 30 tablet, Rfl: 0  Current facility-administered medications:  .  medroxyPROGESTERone (DEPO-PROVERA) injection 150 mg, 150 mg, Intramuscular, Q90 days, [name], MD, 150 mg at 05/06/14 1003 No Known Allergies  Social History  Substance Use Topics  . Smoking status: Never Smoker   . Smokeless tobacco: Not on file  . Alcohol Use: No    Family History  Problem Relation Age of Onset  . Schizophrenia Mother       Review of Systems  Constitutional: negative for fatigue and weight loss Respiratory: negative for cough and wheezing Cardiovascular: negative for chest pain, fatigue and palpitations Gastrointestinal: negative for abdominal pain and change in bowel habits Musculoskeletal:negative for myalgias Neurological: negative for gait problems and tremors Behavioral/Psych: negative for abusive relationship, depression Endocrine: negative for temperature intolerance   Genitourinary:negative for abnormal menstrual periods, genital lesions, hot flashes, sexual problems and vaginal discharge Integument/breast: negative for breast lump, breast tenderness, nipple discharge and skin lesion(s)    Objective:       BP 116/76 mmHg  Pulse 81  Temp(Src) 98.7 F (37.1 C)  Wt 155 lb 4.8 oz (70.444 kg)  LMP 04/24/2014 (Approximate) General:   alert  Skin:   no rash or abnormalities  Lungs:   clear to auscultation bilaterally  Heart:   regular rate and rhythm, S1, S2 normal, no murmur, click, rub or gallop  Breasts:   normal without suspicious masses, skin or nipple changes or axillary nodes  Abdomen:  normal findings: no organomegaly, soft, non-tender and no hernia  Pelvis:  External genitalia: normal general  appearance Urinary system: urethral meatus normal and bladder without fullness, nontender Vaginal: normal without tenderness, induration or masses Cervix: normal appearance Adnexa: normal bimanual exam Uterus: anteverted and non-tender, normal size   Lab Review Urine pregnancy test Labs reviewed yes Radiologic studies reviewed no    Assessment:    Healthy female exam.    Contraceptive management.  Wants to restart Depo Provera.    Plan:   Depo Provera Rx   Education reviewed: low fat, low cholesterol diet, safe sex/STD prevention, self breast exams and weight bearing exercise. Contraception: Depo-Provera injections. Follow up in: 1 year.   Meds ordered this encounter  Medications  . medroxyPROGESTERone (DEPO-PROVERA) 150 MG/ML injection    Sig: Inject 1 mL (150 mg total) into the muscle every 3 (three) months.    Dispense:  1 mL    Refill:  3   Orders Placed This Encounter  Procedures  . SureSwab, Vaginosis/Vaginitis Plus  . TSH  . Comprehensive metabolic panel  . CBC  . HIV antibody  . Hepatitis B surface antigen  . RPR  . Hepatitis C antibody  . POCT urine pregnancy

## 2015-01-21 LAB — PAP, TP IMAGING W/ HPV RNA, RFLX HPV TYPE 16,18/45: HPV mRNA, High Risk: NOT DETECTED

## 2015-01-21 LAB — HEPATITIS B SURFACE ANTIGEN: Hepatitis B Surface Ag: NEGATIVE

## 2015-01-21 LAB — HEPATITIS C ANTIBODY: HCV AB: NEGATIVE

## 2015-01-21 LAB — HIV ANTIBODY (ROUTINE TESTING W REFLEX): HIV 1&2 Ab, 4th Generation: NONREACTIVE

## 2015-01-21 LAB — SYPHILIS: RPR W/REFLEX TO RPR TITER AND TREPONEMAL ANTIBODIES, TRADITIONAL SCREENING AND DIAGNOSIS ALGORITHM

## 2015-01-25 ENCOUNTER — Other Ambulatory Visit: Payer: Self-pay | Admitting: Obstetrics

## 2015-01-25 DIAGNOSIS — B9689 Other specified bacterial agents as the cause of diseases classified elsewhere: Secondary | ICD-10-CM

## 2015-01-25 DIAGNOSIS — N760 Acute vaginitis: Principal | ICD-10-CM

## 2015-01-25 LAB — SURESWAB, VAGINOSIS/VAGINITIS PLUS
Atopobium vaginae: 6 Log (cells/mL)
C. ALBICANS, DNA: NOT DETECTED
C. TRACHOMATIS RNA, TMA: NOT DETECTED
C. TROPICALIS, DNA: NOT DETECTED
C. glabrata, DNA: NOT DETECTED
C. parapsilosis, DNA: NOT DETECTED
GARDNERELLA VAGINALIS: 7.6 Log (cells/mL)
LACTOBACILLUS SPECIES: NOT DETECTED Log (cells/mL)
MEGASPHAERA SPECIES: 6.2 Log (cells/mL)
N. gonorrhoeae RNA, TMA: NOT DETECTED
T. VAGINALIS RNA, QL TMA: NOT DETECTED

## 2015-01-25 MED ORDER — METRONIDAZOLE 500 MG PO TABS: 500.0000 mg | ORAL_TABLET | Freq: Two times a day (BID) | ORAL | Status: DC

## 2015-01-26 ENCOUNTER — Ambulatory Visit: Payer: Medicaid Other | Admitting: Obstetrics

## 2015-04-13 ENCOUNTER — Ambulatory Visit (INDEPENDENT_AMBULATORY_CARE_PROVIDER_SITE_OTHER): Payer: Medicaid Other | Admitting: *Deleted

## 2015-04-13 ENCOUNTER — Ambulatory Visit: Payer: Medicaid Other

## 2015-04-13 VITALS — BP 155/91 | HR 93 | Wt 159.0 lb

## 2015-04-13 DIAGNOSIS — R399 Unspecified symptoms and signs involving the genitourinary system: Secondary | ICD-10-CM | POA: Diagnosis not present

## 2015-04-13 DIAGNOSIS — Z3042 Encounter for surveillance of injectable contraceptive: Secondary | ICD-10-CM | POA: Diagnosis not present

## 2015-04-13 LAB — POCT URINALYSIS DIPSTICK
Bilirubin, UA: NEGATIVE
Blood, UA: 250
GLUCOSE UA: 50
KETONES UA: NEGATIVE
Leukocytes, UA: NEGATIVE
Nitrite, UA: NEGATIVE
Protein, UA: NEGATIVE
SPEC GRAV UA: 1.015
Urobilinogen, UA: NEGATIVE
pH, UA: 5

## 2015-04-13 NOTE — Progress Notes (Signed)
Pt is in office today for depo injection. Pt is on time for her injection.  Pt tolerated injection well.  Pt advised to RTO on 07-05-15 for next injection. Pt states that she has been having some right side pain.  Pt request urine check, UDip in office. Pt did show 250 RBC in urine as she is having some spotting today.  Pt advised will be reviewed with Dr Jodi Mourning to determine if UC to be sent. Pt advised to contact office if pain continues or worsens. Pt states understanding.  Urine culture ordered and to be sent to lab.  BP 155/91 mmHg  Pulse 93  Wt 159 lb (72.122 kg)  LMP  (Approximate)  Administrations This Visit    medroxyPROGESTERone (DEPO-PROVERA) injection 150 mg    Admin Date Action Dose Route Administered By         04/13/2015 Given 150 mg Intramuscular Valene Bors, CMA

## 2015-04-15 LAB — URINE CULTURE
Colony Count: NO GROWTH
Organism ID, Bacteria: NO GROWTH

## 2015-04-19 ENCOUNTER — Telehealth: Payer: Self-pay | Admitting: *Deleted

## 2015-04-19 NOTE — Telephone Encounter (Signed)
Patient contacted the office requesting lab results. Attempted to contact the patient and left message for patient to call the office.  

## 2015-06-18 ENCOUNTER — Emergency Department (HOSPITAL_COMMUNITY)
Admission: EM | Admit: 2015-06-18 | Discharge: 2015-06-18 | Disposition: A | Discharge status: Home / Self Care | Payer: Medicaid Other | Attending: Emergency Medicine | Admitting: Emergency Medicine

## 2015-06-18 ENCOUNTER — Emergency Department (HOSPITAL_COMMUNITY): Payer: Medicaid Other

## 2015-06-18 ENCOUNTER — Encounter (HOSPITAL_COMMUNITY): Payer: Self-pay

## 2015-06-18 DIAGNOSIS — Z3202 Encounter for pregnancy test, result negative: Secondary | ICD-10-CM | POA: Insufficient documentation

## 2015-06-18 DIAGNOSIS — R109 Unspecified abdominal pain: Secondary | ICD-10-CM

## 2015-06-18 DIAGNOSIS — J45909 Unspecified asthma, uncomplicated: Secondary | ICD-10-CM | POA: Diagnosis not present

## 2015-06-18 DIAGNOSIS — Z87440 Personal history of urinary (tract) infections: Secondary | ICD-10-CM | POA: Diagnosis not present

## 2015-06-18 DIAGNOSIS — Z8719 Personal history of other diseases of the digestive system: Secondary | ICD-10-CM | POA: Diagnosis not present

## 2015-06-18 DIAGNOSIS — Z8619 Personal history of other infectious and parasitic diseases: Secondary | ICD-10-CM | POA: Insufficient documentation

## 2015-06-18 DIAGNOSIS — Z79899 Other long term (current) drug therapy: Secondary | ICD-10-CM | POA: Diagnosis not present

## 2015-06-18 DIAGNOSIS — R1031 Right lower quadrant pain: Secondary | ICD-10-CM | POA: Diagnosis not present

## 2015-06-18 DIAGNOSIS — R1030 Lower abdominal pain, unspecified: Secondary | ICD-10-CM | POA: Diagnosis present

## 2015-06-18 DIAGNOSIS — R102 Pelvic and perineal pain: Secondary | ICD-10-CM

## 2015-06-18 LAB — COMPREHENSIVE METABOLIC PANEL
ALT: 18 U/L (ref 14–54)
AST: 22 U/L (ref 15–41)
Albumin: 3.8 g/dL (ref 3.5–5.0)
Alkaline Phosphatase: 58 U/L (ref 38–126)
Anion gap: 10 (ref 5–15)
BUN: 8 mg/dL (ref 6–20)
CALCIUM: 9.4 mg/dL (ref 8.9–10.3)
CHLORIDE: 108 mmol/L (ref 101–111)
CO2: 22 mmol/L (ref 22–32)
CREATININE: 0.74 mg/dL (ref 0.44–1.00)
Glucose, Bld: 103 mg/dL — ABNORMAL HIGH (ref 65–99)
Potassium: 3.7 mmol/L (ref 3.5–5.1)
Sodium: 140 mmol/L (ref 135–145)
Total Bilirubin: 0.6 mg/dL (ref 0.3–1.2)
Total Protein: 7.2 g/dL (ref 6.5–8.1)

## 2015-06-18 LAB — URINALYSIS, ROUTINE W REFLEX MICROSCOPIC
Bilirubin Urine: NEGATIVE
GLUCOSE, UA: NEGATIVE mg/dL
HGB URINE DIPSTICK: NEGATIVE
Ketones, ur: NEGATIVE mg/dL
LEUKOCYTES UA: NEGATIVE
Nitrite: NEGATIVE
PH: 6 (ref 5.0–8.0)
PROTEIN: NEGATIVE mg/dL
SPECIFIC GRAVITY, URINE: 1.022 (ref 1.005–1.030)

## 2015-06-18 LAB — CBC WITH DIFFERENTIAL/PLATELET
BASOS ABS: 0 10*3/uL (ref 0.0–0.1)
BASOS PCT: 0 %
EOS ABS: 0.1 10*3/uL (ref 0.0–0.7)
Eosinophils Relative: 1 %
HEMATOCRIT: 41.7 % (ref 36.0–46.0)
HEMOGLOBIN: 14.4 g/dL (ref 12.0–15.0)
Lymphocytes Relative: 50 %
Lymphs Abs: 2.7 10*3/uL (ref 0.7–4.0)
MCH: 30.3 pg (ref 26.0–34.0)
MCHC: 34.5 g/dL (ref 30.0–36.0)
MCV: 87.6 fL (ref 78.0–100.0)
MONO ABS: 0.3 10*3/uL (ref 0.1–1.0)
Monocytes Relative: 6 %
NEUTROS ABS: 2.3 10*3/uL (ref 1.7–7.7)
NEUTROS PCT: 43 %
Platelets: 331 10*3/uL (ref 150–400)
RBC: 4.76 MIL/uL (ref 3.87–5.11)
RDW: 12.2 % (ref 11.5–15.5)
WBC: 5.4 10*3/uL (ref 4.0–10.5)

## 2015-06-18 LAB — WET PREP, GENITAL
CLUE CELLS WET PREP: NONE SEEN
Sperm: NONE SEEN
TRICH WET PREP: NONE SEEN
YEAST WET PREP: NONE SEEN

## 2015-06-18 LAB — POC URINE PREG, ED: Preg Test, Ur: NEGATIVE

## 2015-06-18 LAB — LIPASE, BLOOD: LIPASE: 29 U/L (ref 11–51)

## 2015-06-18 MED ORDER — ORPHENADRINE CITRATE ER 100 MG PO TB12: 100.0000 mg | ORAL_TABLET | Freq: Two times a day (BID) | ORAL | Status: DC

## 2015-06-18 MED ORDER — KETOROLAC TROMETHAMINE 60 MG/2ML IM SOLN
60.0000 mg | Freq: Once | INTRAMUSCULAR | Status: AC
  Administered 2015-06-18: 60 mg via INTRAMUSCULAR
  Filled 2015-06-18: qty 2

## 2015-06-18 MED ORDER — NAPROXEN 500 MG PO TABS: 500.0000 mg | ORAL_TABLET | Freq: Two times a day (BID) | ORAL | Status: DC

## 2015-06-18 NOTE — ED Provider Notes (Signed)
CSN: [id number]     Arrival date & time 06/18/15  1041 History   First MD Initiated Contact with Patient 06/18/15 1043     Chief Complaint  Patient presents with  . Flank Pain     (Consider location/radiation/quality/duration/timing/severity/associated sxs/prior Treatment) HPI Patient reports fairly sudden onset of pain last night around 6 PM. She reports she has pain in her right flank and lower abdomen. She indicates the pain seems to be somewhat in the right flank and then radiating lower. It is been constant and severe. It is sharp in nature. She has not had any associated pain burning or urgency of urination. No vomiting or diarrhea. She tried some Tylenol with some mild relief but the pain was significant again this morning when she awakened. Patient is occasionally sexually active. He reports last sexual activity about 3 months ago. She reports intermittent brown spotting for several weeks. Birth control is with Depo-Provera. She denies any history of similar pain. She reports she had normal GYN exam and STD testing several months ago. Past Medical History  Diagnosis Date  . Asthma   . GERD (gastroesophageal reflux disease)   . UTI (urinary tract infection)   . Pyelonephritis   . Chlamydia   . Trichomonas    Past Surgical History  Procedure Laterality Date  . Hernia repair    . Cardiac surgery      as newborn   Family History  Problem Relation Age of Onset  . Schizophrenia Mother    Social History  Substance Use Topics  . Smoking status: Never Smoker   . Smokeless tobacco: None  . Alcohol Use: No   OB History    Gravida Para Term Preterm AB TAB SAB Ectopic Multiple Living   3 3 2 1  0 0 0 0 0 3     Review of Systems 10 Systems reviewed and are negative for acute change except as noted in the HPI.   Allergies  Review of patient's allergies indicates no known allergies.  Home Medications   Prior to Admission medications   Medication Sig Start Date End Date  Taking? Authorizing Provider  acetaminophen (TYLENOL) 325 MG tablet Take 650 mg by mouth daily as needed for moderate pain.   Yes Historical Provider, MD  albuterol (PROVENTIL HFA;VENTOLIN HFA) 108 (90 BASE) MCG/ACT inhaler Inhale 1-2 puffs into the lungs every 6 (six) hours as needed for wheezing or shortness of breath.    Yes Historical Provider, MD  medroxyPROGESTERone (DEPO-PROVERA) 150 MG/ML injection Inject 1 mL (150 mg total) into the muscle every 3 (three) months. 01/20/15  Yes [name], MD  Probiotic Product (PROBIOTIC PO) Take 1 tablet by mouth daily.   Yes Historical Provider, MD  metroNIDAZOLE (FLAGYL) 500 MG tablet Take 1 tablet (500 mg total) by mouth 2 (two) times daily. Patient not taking: Reported on 06/18/2015 01/25/15   [name], MD  naproxen (NAPROSYN) 500 MG tablet Take 1 tablet (500 mg total) by mouth 2 (two) times daily. 06/18/15   Charlesetta Shanks, MD  orphenadrine (NORFLEX) 100 MG tablet Take 1 tablet (100 mg total) by mouth 2 (two) times daily. 06/18/15   Charlesetta Shanks, MD  pantoprazole (PROTONIX) 20 MG tablet Take 1 tablet (20 mg total) by mouth daily. Patient not taking: Reported on 05/06/2014 08/29/13   Elyn Peers, MD   BP 113/61 mmHg  Pulse 66  Temp(Src) 98.1 F (36.7 C)  Resp 16  Ht 5\' 2"  (1.575 m)  Wt 160 lb (  72.576 kg)  BMI 29.26 kg/m2  SpO2 99% Physical Exam  Constitutional: She is oriented to person, place, and time. She appears well-developed and well-nourished.  HENT:  Head: Normocephalic and atraumatic.  Eyes: EOM are normal. Pupils are equal, round, and reactive to light.  Neck: Neck supple.  Cardiovascular: Normal rate, regular rhythm, normal heart sounds and intact distal pulses.   Pulmonary/Chest: Effort normal and breath sounds normal.  Abdominal: Soft. Bowel sounds are normal. She exhibits no distension. There is tenderness.  Mild right lower quadrant tenderness to palpation. Pain is more pronounced in the flank area. Guarding or  rebound.  Genitourinary:  Normal external female genitalia. Speculum examination moderate low discharge in the vaginal vault. Cervix has mucousy yellow discharge but no friability or erythema of the cervix. At present. Manual examination, no significant cervical motion tenderness. She does perceive that with isolation in the right adnexa she gets radiation of pain into her flank. This is fairly severe. Left adnexa is nontender.  Musculoskeletal: Normal range of motion. She exhibits no edema or tenderness.  Neurological: She is alert and oriented to person, place, and time. She has normal strength. Coordination normal. GCS eye subscore is 4. GCS verbal subscore is 5. GCS motor subscore is 6.  Skin: Skin is warm, dry and intact.  Psychiatric: She has a normal mood and affect.    ED Course  Procedures (including critical care time) Labs Review Labs Reviewed  WET PREP, GENITAL - Abnormal; Notable for the following:    WBC, Wet Prep HPF POC TOO NUMEROUS TO COUNT (*)    All other components within normal limits  COMPREHENSIVE METABOLIC PANEL - Abnormal; Notable for the following:    Glucose, Bld 103 (*)    All other components within normal limits  URINALYSIS, ROUTINE W REFLEX MICROSCOPIC (NOT AT [facility])  LIPASE, BLOOD  CBC WITH DIFFERENTIAL/PLATELET  POC URINE PREG, ED  GC/CHLAMYDIA PROBE AMP () NOT AT [facility]    Imaging Review US Transvaginal Non-ob  06/18/2015  CLINICAL DATA:  Right pelvic pain. Right adnexal mass and tenderness. Calcified left adnexal lesion seen on previous CT and ultrasound. EXAM: TRANSABDOMINAL AND TRANSVAGINAL ULTRASOUND OF PELVIS TECHNIQUE: Both transabdominal and transvaginal ultrasound examinations of the pelvis were performed. Transabdominal technique was performed for global imaging of the pelvis including uterus, ovaries, adnexal regions, and pelvic cul-de-sac. It was necessary to proceed with endovaginal exam following the transabdominal exam to visualize  the endometrial stripe and ovaries. COMPARISON:  CT on 06/18/2015 and ultrasound on 05/05/2014 FINDINGS: Uterus Measurements: 9.1 x 4.5 x 5.0 cm. No fibroids or other mass visualized. Endometrium Thickness: 3 mm.  No focal abnormality visualized. Right ovary Measurements: 3.6 x 1.5 x 2.3 cm. Normal appearance/no adnexal mass. Left ovary Measurements: 3.6 x 2.4 x 3.1 cm. A densely calcified lesion with acoustic shadowing is seen in the left ovary measuring 2.2 cm in diameter. This is stable compared to previous ultrasound a consistent with benign etiology. Other findings No abnormal free fluid. IMPRESSION: Stable 2.2 cm densely calcified lesion in left ovary, consistent with benign etiology. Normal appearance of uterus and right ovary. Electronically Signed   By: Earle Gell M.D.   On: 06/18/2015 16:24   US Pelvis Complete  06/18/2015  CLINICAL DATA:  Right pelvic pain. Right adnexal mass and tenderness. Calcified left adnexal lesion seen on previous CT and ultrasound. EXAM: TRANSABDOMINAL AND TRANSVAGINAL ULTRASOUND OF PELVIS TECHNIQUE: Both transabdominal and transvaginal ultrasound examinations of the pelvis were performed. Transabdominal technique  was performed for global imaging of the pelvis including uterus, ovaries, adnexal regions, and pelvic cul-de-sac. It was necessary to proceed with endovaginal exam following the transabdominal exam to visualize the endometrial stripe and ovaries. COMPARISON:  CT on 06/18/2015 and ultrasound on 05/05/2014 FINDINGS: Uterus Measurements: 9.1 x 4.5 x 5.0 cm. No fibroids or other mass visualized. Endometrium Thickness: 3 mm.  No focal abnormality visualized. Right ovary Measurements: 3.6 x 1.5 x 2.3 cm. Normal appearance/no adnexal mass. Left ovary Measurements: 3.6 x 2.4 x 3.1 cm. A densely calcified lesion with acoustic shadowing is seen in the left ovary measuring 2.2 cm in diameter. This is stable compared to previous ultrasound a consistent with benign etiology.  Other findings No abnormal free fluid. IMPRESSION: Stable 2.2 cm densely calcified lesion in left ovary, consistent with benign etiology. Normal appearance of uterus and right ovary. Electronically Signed   By: Earle Gell M.D.   On: 06/18/2015 16:24   Ct Renal Stone Study  06/18/2015  CLINICAL DATA:  35 year old female with acute right abdominal and flank pain. EXAM: CT ABDOMEN AND PELVIS WITHOUT CONTRAST TECHNIQUE: Multidetector CT imaging of the abdomen and pelvis was performed following the standard protocol without IV contrast. COMPARISON:  05/07/2009 CT FINDINGS: Please note that parenchymal abnormalities may be missed without intravenous contrast. Lower chest:  Minimal basilar scarring again noted. Hepatobiliary: The liver and gallbladder are unremarkable. There is no evidence of biliary dilatation. Pancreas: Unremarkable Spleen: Unremarkable Adrenals/Urinary Tract: The kidneys, adrenal glands and bladder are unremarkable. There is no evidence of hydronephrosis or urinary calculi. Stomach/Bowel: Unremarkable. There is no evidence of bowel obstruction or definite bowel wall thickening. The appendix is normal. Vascular/Lymphatic: No enlarged lymph nodes or abdominal aortic aneurysm. Reproductive: A 2.7 cm densely calcified left ovarian lesion is unchanged in size since 2010. The uterus and right adnexal region are unremarkable. Other: No free fluid, focal collection or pneumoperitoneum. Musculoskeletal: No acute or suspicious abnormalities. IMPRESSION: No acute or significant abnormalities. No evidence of urinary calculi or hydronephrosis. Electronically Signed   By: Margarette Canada M.D.   On: 06/18/2015 12:48   I have personally reviewed and evaluated these images and lab results as part of my medical decision-making.   EKG Interpretation None      MDM   Final diagnoses:  Flank pain   Patient presented with acute onset of severe flank and right lower quadrant pain. Differential diagnosis included  kidney stone, ovarian torsion or cyst, appendicitis. At this time diagnostic workup is negative. Patient is otherwise well without signs of toxicity. She will be treated for pain with naproxen and Norflex. Follow-up with her family physician.    Charlesetta Shanks, MD 06/18/15 [phone]

## 2015-06-18 NOTE — ED Notes (Signed)
Pt c/o intermittent right lower back/side pain, onset last night around 1800. Denies urinary symptoms/n/v/d. Tylenol provided some relief of pain. Pt denies hx kidney stones.

## 2015-06-18 NOTE — Discharge Instructions (Signed)
Flank Pain Flank pain refers to pain that is located on the side of the body between the upper abdomen and the back. The pain may occur over a short period of time (acute) or may be long-term or reoccurring (chronic). It may be mild or severe. Flank pain can be caused by many things. CAUSES  Some of the more common causes of flank pain include:  Muscle strains.   Muscle spasms.   A disease of your spine (vertebral disk disease).   A lung infection (pneumonia).   Fluid around your lungs (pulmonary edema).   A kidney infection.   Kidney stones.   A very painful skin rash caused by the chickenpox virus (shingles).   Gallbladder disease.  [place] care will depend on the cause of your pain. In general,  Rest as directed by your caregiver.  Drink enough fluids to keep your urine clear or pale yellow.  Only take over-the-counter or prescription medicines as directed by your caregiver. Some medicines may help relieve the pain.  Tell your caregiver about any changes in your pain.  Follow up with your caregiver as directed. SEEK IMMEDIATE MEDICAL CARE IF:   Your pain is not controlled with medicine.   You have new or worsening symptoms.  Your pain increases.   You have abdominal pain.   You have shortness of breath.   You have persistent nausea or vomiting.   You have swelling in your abdomen.   You feel faint or pass out.   You have blood in your urine.  You have a fever or persistent symptoms for more than 2-3 days.  You have a fever and your symptoms suddenly get worse. MAKE SURE YOU:   Understand these instructions.  Will watch your condition.  Will get help right away if you are not doing well or get worse.   This information is not intended to replace advice given to you by your health care provider. Make sure you discuss any questions you have with your health care provider.   Document Released: 06/28/2005 Document  Revised: 01/30/2012 Document Reviewed: 12/20/2011 Elsevier Interactive Patient Education 2016 Else[employer]  Abdominal Pain, Adult Many things can cause abdominal pain. Usually, abdominal pain is not caused by a disease and will improve without treatment. It can often be observed and treated at home. Your health care provider will do a physical exam and possibly order blood tests and X-rays to help determine the seriousness of your pain. However, in many cases, more time must pass before a clear cause of the pain can be found. Before that point, your health care provider may not know if you need more testing or further treatment. HOME CARE INSTRUCTIONS Monitor your abdominal pain for any changes. The following actions may help to alleviate any discomfort you are experiencing:  Only take over-the-counter or prescription medicines as directed by your health care provider.  Do not take laxatives unless directed to do so by your health care provider.  Try a clear liquid diet (broth, tea, or water) as directed by your health care provider. Slowly move to a bland diet as tolerated. SEEK MEDICAL CARE IF:  You have unexplained abdominal pain.  You have abdominal pain associated with nausea or diarrhea.  You have pain when you urinate or have a bowel movement.  You experience abdominal pain that wakes you in the night.  You have abdominal pain that is worsened or improved by eating food.  You have abdominal pain  that is worsened with eating fatty foods.  You have a fever. SEEK IMMEDIATE MEDICAL CARE IF:  Your pain does not go away within 2 hours.  You keep throwing up (vomiting).  Your pain is felt only in portions of the abdomen, such as the right side or the left lower portion of the abdomen.  You pass bloody or black tarry stools. MAKE SURE YOU:  Understand these instructions.  Will watch your condition.  Will get help right away if you are not doing well or get worse.   This  information is not intended to replace advice given to you by your health care provider. Make sure you discuss any questions you have with your health care provider.   Document Released: 02/14/2005 Document Revised: 01/26/2015 Document Reviewed: 01/14/2013 Elsevier Interactive Patient Education [employer].

## 2015-06-19 ENCOUNTER — Telehealth: Payer: Self-pay | Admitting: *Deleted

## 2015-06-19 NOTE — Telephone Encounter (Signed)
Reviewed case and medications with PA-c in [place] [facility]. Nikki substituted Robaxin 500mg  po Bid x 10days. #20. No refills. CM called to [facility] at [place].  [phone].

## 2015-06-20 LAB — GC/CHLAMYDIA PROBE AMP (~~LOC~~) NOT AT ARMC
Chlamydia: NEGATIVE
NEISSERIA GONORRHEA: NEGATIVE

## 2015-07-05 ENCOUNTER — Ambulatory Visit: Payer: Medicaid Other

## 2015-07-06 ENCOUNTER — Ambulatory Visit: Payer: Medicaid Other

## 2015-07-29 ENCOUNTER — Ambulatory Visit: Payer: Medicaid Other | Admitting: Obstetrics

## 2015-08-01 ENCOUNTER — Ambulatory Visit: Payer: Medicaid Other | Admitting: Obstetrics

## 2015-08-02 ENCOUNTER — Ambulatory Visit: Payer: Self-pay | Admitting: Obstetrics

## 2015-08-08 ENCOUNTER — Emergency Department (HOSPITAL_COMMUNITY)
Admission: EM | Admit: 2015-08-08 | Discharge: 2015-08-08 | Disposition: A | Discharge status: Home / Self Care | Payer: Medicaid Other | Attending: Emergency Medicine | Admitting: Emergency Medicine

## 2015-08-08 ENCOUNTER — Emergency Department (HOSPITAL_COMMUNITY): Payer: Medicaid Other

## 2015-08-08 ENCOUNTER — Encounter (HOSPITAL_COMMUNITY): Payer: Self-pay | Admitting: Emergency Medicine

## 2015-08-08 DIAGNOSIS — Z79899 Other long term (current) drug therapy: Secondary | ICD-10-CM | POA: Diagnosis not present

## 2015-08-08 DIAGNOSIS — Z87440 Personal history of urinary (tract) infections: Secondary | ICD-10-CM | POA: Diagnosis not present

## 2015-08-08 DIAGNOSIS — Z8619 Personal history of other infectious and parasitic diseases: Secondary | ICD-10-CM | POA: Insufficient documentation

## 2015-08-08 DIAGNOSIS — J069 Acute upper respiratory infection, unspecified: Secondary | ICD-10-CM | POA: Insufficient documentation

## 2015-08-08 DIAGNOSIS — J45901 Unspecified asthma with (acute) exacerbation: Secondary | ICD-10-CM | POA: Insufficient documentation

## 2015-08-08 DIAGNOSIS — B9789 Other viral agents as the cause of diseases classified elsewhere: Secondary | ICD-10-CM

## 2015-08-08 DIAGNOSIS — R05 Cough: Secondary | ICD-10-CM | POA: Diagnosis present

## 2015-08-08 DIAGNOSIS — J988 Other specified respiratory disorders: Secondary | ICD-10-CM

## 2015-08-08 MED ORDER — PREDNISONE 10 MG (21) PO TBPK: 10.0000 mg | ORAL_TABLET | Freq: Every day | ORAL | Status: DC

## 2015-08-08 MED ORDER — FLUTICASONE PROPIONATE 50 MCG/ACT NA SUSP: 2.0000 | Freq: Every day | NASAL | Status: DC

## 2015-08-08 MED ORDER — IPRATROPIUM-ALBUTEROL 0.5-2.5 (3) MG/3ML IN SOLN
3.0000 mL | Freq: Once | RESPIRATORY_TRACT | Status: AC
  Administered 2015-08-08: 3 mL via RESPIRATORY_TRACT
  Filled 2015-08-08: qty 3

## 2015-08-08 MED ORDER — BENZONATATE 100 MG PO CAPS: 100.0000 mg | ORAL_CAPSULE | Freq: Three times a day (TID) | ORAL | Status: DC | PRN

## 2015-08-08 MED ORDER — IBUPROFEN 800 MG PO TABS: 800.0000 mg | ORAL_TABLET | Freq: Three times a day (TID) | ORAL | Status: DC | PRN

## 2015-08-08 MED ORDER — DEXAMETHASONE SODIUM PHOSPHATE 10 MG/ML IJ SOLN
10.0000 mg | Freq: Once | INTRAMUSCULAR | Status: AC
  Administered 2015-08-08: 10 mg via INTRAMUSCULAR
  Filled 2015-08-08: qty 1

## 2015-08-08 MED ORDER — ALBUTEROL SULFATE HFA 108 (90 BASE) MCG/ACT IN AERS
2.0000 | INHALATION_SPRAY | Freq: Once | RESPIRATORY_TRACT | Status: AC
  Administered 2015-08-08: 2 via RESPIRATORY_TRACT
  Filled 2015-08-08: qty 6.7

## 2015-08-08 MED ORDER — GUAIFENESIN 100 MG/5ML PO LIQD: 100.0000 mg | ORAL | Status: DC | PRN

## 2015-08-08 NOTE — ED Provider Notes (Signed)
CSN: [id number]     Arrival date & time 08/08/15  1113 History  By signing my name below, I, Emmanuella Mensah, attest that this documentation has been prepared under the direction and in the presence of Nalanie Winiecki, PA-C. Electronically Signed: Judithann Sauger, ED Scribe. 08/08/2015. 1:09 PM.    Chief Complaint  Patient presents with  . Generalized Body Aches  . Cough   The history is provided by the patient. No language interpreter was used.   HPI Comments: Tamara Waller is a 35 y.o. female with a hx of asthma who presents to the Emergency Department complaining of gradually worsening persistent painful productive cough onset 4 days ago. She reports associated generalized body aches (worse on the right lower back pain), wheezing, SOB, nasal congestion, rhinorrhea, mild sore throat. She adds that she has CP with cough which makes her hold in her coughs. She states that she has tried breathing treatments, Tylenol, and 400 mg Ibuprofen with no relief. She denies that she received her flu vaccine this season. She reports sick contacts at home with her children. She denies any urinary symptoms, vaginal symptoms, leg swelling, or n/v.    Past Medical History  Diagnosis Date  . Asthma   . GERD (gastroesophageal reflux disease)   . UTI (urinary tract infection)   . Pyelonephritis   . Chlamydia   . Trichomonas    Past Surgical History  Procedure Laterality Date  . Hernia repair    . Cardiac surgery      as newborn   Family History  Problem Relation Age of Onset  . Schizophrenia Mother    Social History  Substance Use Topics  . Smoking status: Never Smoker   . Smokeless tobacco: None  . Alcohol Use: No   OB History    Gravida Para Term Preterm AB TAB SAB Ectopic Multiple Living   3 3 2 1  0 0 0 0 0 3     Review of Systems  HENT: Positive for congestion, rhinorrhea and sore throat (mild).   Respiratory: Positive for cough, shortness of breath and wheezing.   Cardiovascular:  Negative for leg swelling.  Gastrointestinal: Negative for nausea and vomiting.  Genitourinary: Negative for dysuria, frequency, hematuria, vaginal bleeding and vaginal discharge.  Musculoskeletal: Positive for myalgias.  Allergic/Immunologic: Negative for immunocompromised state.      Allergies  Review of patient's allergies indicates no known allergies.  Home Medications   Prior to Admission medications   Medication Sig Start Date End Date Taking? Authorizing Provider  acetaminophen (TYLENOL) 325 MG tablet Take 650 mg by mouth daily as needed for moderate pain.    Historical Provider, MD  albuterol (PROVENTIL HFA;VENTOLIN HFA) 108 (90 BASE) MCG/ACT inhaler Inhale 1-2 puffs into the lungs every 6 (six) hours as needed for wheezing or shortness of breath.     Historical Provider, MD  medroxyPROGESTERone (DEPO-PROVERA) 150 MG/ML injection Inject 1 mL (150 mg total) into the muscle every 3 (three) months. 01/20/15   [name], MD  metroNIDAZOLE (FLAGYL) 500 MG tablet Take 1 tablet (500 mg total) by mouth 2 (two) times daily. Patient not taking: Reported on 06/18/2015 01/25/15   [name], MD  naproxen (NAPROSYN) 500 MG tablet Take 1 tablet (500 mg total) by mouth 2 (two) times daily. 06/18/15   Charlesetta Shanks, MD  orphenadrine (NORFLEX) 100 MG tablet Take 1 tablet (100 mg total) by mouth 2 (two) times daily. 06/18/15   Charlesetta Shanks, MD  pantoprazole ([place]) 20  MG tablet Take 1 tablet (20 mg total) by mouth daily. Patient not taking: Reported on 05/06/2014 08/29/13   Elyn Peers, MD  Probiotic Product (PROBIOTIC PO) Take 1 tablet by mouth daily.    Historical Provider, MD   BP 110/76 mmHg  Pulse 97  Temp(Src) 99.8 F (37.7 C) (Oral)  Resp 18  SpO2 100% Physical Exam  Constitutional: She appears well-developed and well-nourished. No distress.  HENT:  Head: Normocephalic and atraumatic.  Nose: Mucosal edema present.  Mouth/Throat: Posterior oropharyngeal edema and  posterior oropharyngeal erythema present. No oropharyngeal exudate.  Eyes: Conjunctivae are normal.  Neck: Neck supple.  Cardiovascular: Normal rate and regular rhythm.   Pulmonary/Chest: Effort normal and breath sounds normal. No respiratory distress. She has no wheezes. She has no rales.  Neurological: She is alert.  Skin: She is not diaphoretic.  Nursing note and vitals reviewed.   ED Course  Procedures (including critical care time) DIAGNOSTIC STUDIES: Oxygen Saturation is 100% on RA, normal by my interpretation.    COORDINATION OF CARE: 1:04 PM- Pt advised of plan for treatment and pt agrees. Pt will receive chest x-ray, Duo neb, and decadron.    Imaging Review Dg Chest 2 View  08/08/2015  CLINICAL DATA:  Chest pain, cough and congestion for 4 days. Initial encounter. EXAM: CHEST  2 VIEW COMPARISON:  PA and lateral chest 08/28/2013 and 02/27/2012. CT chest 06/08/2009. FINDINGS: The lungs are clear. Heart size is normal. There is no pneumothorax or pleural effusion. No bony abnormality is identified. IMPRESSION: Negative chest. Electronically Signed   By: Inge Rise M.D.   On: 08/08/2015 12:56     [name], PA-C has personally reviewed and evaluated these images as part of her medical decision-making.  Repeat examination following neb treatment, lungs remain clear, moving air well in all fields.    MDM   Final diagnoses:  Viral respiratory illness    Afebrile, nontoxic patient with constellation of symptoms suggestive of viral syndrome.  No concerning findings on exam.  Discharged home with supportive care, PCP follow up.  Discussed result, findings, treatment, and follow up  with patient.  Pt given return precautions.  Pt verbalizes understanding and agrees with plan.      I personally performed the services described in this documentation, which was scribed in my presence. The recorded information has been reviewed and is accurate.   [name], PA-C 08/08/15  [place], DO 08/08/15 1433

## 2015-08-08 NOTE — Discharge Instructions (Signed)
Read the information below.  Use the prescribed medication as directed.  Please discuss all new medications with your pharmacist.  You may return to the Emergency Department at any time for worsening condition or any new symptoms that concern you.  If you develop high fevers that do not resolve with tylenol or ibuprofen, you have difficulty swallowing or breathing, or you are unable to tolerate fluids by mouth, return to the ER for a recheck.      Viral Infections A virus is a type of germ. Viruses can cause:  Minor sore throats.  Aches and pains.  Headaches.  Runny nose.  Rashes.  Watery eyes.  Tiredness.  Coughs.  Loss of appetite.  Feeling sick to your stomach (nausea).  Throwing up (vomiting).  Watery poop (diarrhea). HOME CARE   Only take medicines as told by your doctor.  Drink enough water and fluids to keep your pee (urine) clear or pale yellow. Sports drinks are a good choice.  Get plenty of rest and eat healthy. Soups and broths with crackers or rice are fine. GET HELP RIGHT AWAY IF:   You have a very bad headache.  You have shortness of breath.  You have chest pain or neck pain.  You have an unusual rash.  You cannot stop throwing up.  You have watery poop that does not stop.  You cannot keep fluids down.  You or your child has a temperature by mouth above 102 F (38.9 C), not controlled by medicine.  Your baby is older than 3 months with a rectal temperature of 102 F (38.9 C) or higher.  Your baby is 1 months old or younger with a rectal temperature of 100.4 F (38 C) or higher. MAKE SURE YOU:   Understand these instructions.  Will watch this condition.  Will get help right away if you are not doing well or get worse.   This information is not intended to replace advice given to you by your health care provider. Make sure you discuss any questions you have with your health care provider.   Document Released: 04/19/2008 Document  Revised: 07/30/2011 Document Reviewed: 10/13/2014 Elsevier Interactive Patient Education [employer].

## 2015-08-08 NOTE — ED Notes (Signed)
Declined W/C at D/C and was escorted to lobby by RN. 

## 2015-08-08 NOTE — ED Notes (Signed)
Pt sts body aches and cough with congestion x 4 days

## 2015-08-12 NOTE — Telephone Encounter (Signed)
Patient has not returned call to office. Call to be re-filed.

## 2015-09-15 ENCOUNTER — Other Ambulatory Visit (INDEPENDENT_AMBULATORY_CARE_PROVIDER_SITE_OTHER): Payer: Medicaid Other | Admitting: *Deleted

## 2015-09-15 VITALS — BP 112/71 | HR 69 | Wt 160.0 lb

## 2015-09-15 DIAGNOSIS — Z3202 Encounter for pregnancy test, result negative: Secondary | ICD-10-CM

## 2015-09-15 DIAGNOSIS — Z3042 Encounter for surveillance of injectable contraceptive: Secondary | ICD-10-CM

## 2015-09-15 DIAGNOSIS — Z3049 Encounter for surveillance of other contraceptives: Secondary | ICD-10-CM | POA: Diagnosis not present

## 2015-09-15 LAB — POCT URINE PREGNANCY: Preg Test, Ur: NEGATIVE

## 2015-09-15 MED ORDER — MEDROXYPROGESTERONE ACETATE 150 MG/ML IM SUSP
150.0000 mg | Freq: Once | INTRAMUSCULAR | Status: AC
  Administered 2015-09-15: 150 mg via INTRAMUSCULAR

## 2015-11-14 ENCOUNTER — Encounter: Payer: Self-pay | Admitting: *Deleted

## 2015-12-07 ENCOUNTER — Ambulatory Visit: Payer: Medicaid Other

## 2016-04-23 ENCOUNTER — Ambulatory Visit: Payer: Medicaid Other | Admitting: Obstetrics

## 2016-07-22 ENCOUNTER — Encounter (HOSPITAL_COMMUNITY): Payer: Self-pay | Admitting: *Deleted

## 2016-07-22 ENCOUNTER — Ambulatory Visit (HOSPITAL_COMMUNITY)
Admission: EM | Admit: 2016-07-22 | Discharge: 2016-07-22 | Disposition: A | Discharge status: Home / Self Care | Payer: Medicaid Other | Attending: Internal Medicine | Admitting: Internal Medicine

## 2016-07-22 DIAGNOSIS — J069 Acute upper respiratory infection, unspecified: Secondary | ICD-10-CM | POA: Diagnosis not present

## 2016-07-22 MED ORDER — BENZONATATE 200 MG PO CAPS: 200.0000 mg | ORAL_CAPSULE | Freq: Three times a day (TID) | ORAL | 1 refills | Status: DC | PRN

## 2016-07-22 MED ORDER — ALBUTEROL SULFATE HFA 108 (90 BASE) MCG/ACT IN AERS: 1.0000 | INHALATION_SPRAY | RESPIRATORY_TRACT | 0 refills | Status: DC | PRN

## 2016-07-22 MED ORDER — PREDNISONE 50 MG PO TABS: 50.0000 mg | ORAL_TABLET | Freq: Every day | ORAL | 0 refills | Status: DC

## 2016-07-22 NOTE — ED Provider Notes (Signed)
[place]    CSN: [id number] Arrival date & time: 07/22/16  1201     History   Chief Complaint Chief Complaint  Patient presents with  . URI    HPI Tamara Waller is a 36 y.o. female. She presents today with onset yesterday of cough, achiness.  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  Worried about the flu. Hx asthma.   No flu shot this year. Has achiness, headache.  No fever.  Cough prod clear mucus.  Central chest hurts with cough, laugh.  Nasal discharge, post nasal drainage started today.  Sinus congestion.  No sore throat.  No N/V, no diarrhea.  Hoarseness.      HPI  Past Medical History:  Diagnosis Date  . Asthma   . Chlamydia   . GERD (gastroesophageal reflux disease)   . Pyelonephritis   . Trichomonas   . UTI (urinary tract infection)     Patient Active Problem List   Diagnosis Date Noted  . UTI symptoms 10/14/2013  . Constipation, chronic 10/14/2013  . Urinary tract infection, site not specified 08/04/2013  . Toenail fungus 02/17/2013  . Other and unspecified ovarian cyst 11/28/2012  . BV (bacterial vaginosis) 11/28/2012  . Dermatitis 11/28/2012  . 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 Vaginitis and vulvovaginitis, unspecified 10/23/2012    Past Surgical History:  Procedure Laterality Date  . CARDIAC SURGERY     as newborn  . HERNIA REPAIR      OB History    Gravida Para Term Preterm AB Living   3 3 2 1  0 3   SAB TAB Ectopic Multiple Live Births   0 0 0 0 3       Home Medications    Prior to Admission medications   Medication Sig Start Date End Date Taking? Authorizing Provider  acetaminophen (TYLENOL) 325 MG tablet Take 650 mg by mouth daily as needed for moderate pain.    Historical Provider, MD  albuterol (PROVENTIL HFA;VENTOLIN HFA) 108 (90 Base) MCG/ACT inhaler Inhale 1-2 puffs into the lungs every 4 (four) hours as needed for wheezing or shortness of breath. 07/22/16   Sherlene Shams, MD  benzonatate  (TESSALON) 200 MG capsule Take 1 capsule (200 mg total) by mouth 3 (three) times daily as needed for cough. 07/22/16   Sherlene Shams, MD  fluticasone [facility]) 50 MCG/ACT nasal spray Place 2 sprays into both nostrils daily. 08/08/15   [name], PA-C  guaiFENesin (ROBITUSSIN) 100 MG/5ML liquid Take 5-10 mLs (100-200 mg total) by mouth every 4 (four) hours as needed for cough. 08/08/15   [name], PA-C  ibuprofen (ADVIL,MOTRIN) 800 MG tablet Take 1 tablet (800 mg total) by mouth every 8 (eight) hours as needed for mild pain or moderate pain. 08/08/15   [name], PA-C  medroxyPROGESTERone (DEPO-PROVERA) 150 MG/ML injection Inject 1 mL (150 mg total) into the muscle every 3 (three) months. 01/20/15   [name], MD  naproxen (NAPROSYN) 500 MG tablet Take 1 tablet (500 mg total) by mouth 2 (two) times daily. 06/18/15   Charlesetta Shanks, MD  orphenadrine (NORFLEX) 100 MG tablet Take 1 tablet (100 mg total) by mouth 2 (two) times daily. 06/18/15   Charlesetta Shanks, MD  predniSONE (DELTASONE) 50 MG tablet Take 1 tablet (50 mg total) by mouth daily. 07/22/16   Sherlene Shams, MD  Probiotic Product (PROBIOTIC PO) Take 1 tablet by mouth daily.    Historical Provider,  MD    Family History Family History  Problem Relation Age of Onset  . Schizophrenia Mother     Social History Social History  Substance Use Topics  . Smoking status: Never Smoker  . Smokeless tobacco: Not on file  . Alcohol use No     Allergies   Patient has no known allergies.   Review of Systems Review of Systems  All other systems reviewed and are negative.    Physical Exam Triage Vital Signs ED Triage Vitals [07/22/16 1220]  Enc Vitals Group     BP 128/80     Pulse Rate 82     Resp 18     Temp 98.2 F (36.8 C)     Temp Source Oral     SpO2 99 %     Weight      Height      Pain Score      Pain Loc    Updated Vital Signs BP 128/80 (BP Location: Right Arm)   Pulse 82   Temp 98.2 F (36.8 C) (Oral)   Resp 18    SpO2 99%   Physical Exam  Constitutional: She is oriented to person, place, and time. No distress.  Alert, nicely groomed Sitting up on the end of the exam table Voice sounds congested  HENT:  Head: Atraumatic.  Right TM is translucent, left TM is moderately dull, no erythema bilaterally Marked nasal congestion Throat is pink  Eyes:  Conjugate gaze, no eye redness/drainage  Neck: Neck supple.  Cardiovascular: Normal rate and regular rhythm.   Pulmonary/Chest: No respiratory distress. She has no wheezes. She has no rales.  Coarse but symmetric breath sounds throughout  Abdominal: She exhibits no distension.  Musculoskeletal: Normal range of motion.  No leg swelling  Neurological: She is alert and oriented to person, place, and time.  Skin: Skin is warm and dry.  No cyanosis  Nursing note and vitals reviewed.    UC Treatments / Results   Procedures Procedures (including critical care time) None today  Final Clinical Impressions(s) / UC Diagnoses   Final diagnoses:  Upper respiratory tract infection, unspecified type   Anticipate gradual improvement in cough, sinus drainage over the next several days.  Cough may take a couple weeks to subside.  Recheck for new fever >100.5, increasing phlegm production/nasal discharge, or if not starting to improve in a few days.  Prescriptions sent to [facility] on [address].    New Prescriptions New Prescriptions   BENZONATATE (TESSALON) 200 MG CAPSULE    Take 1 capsule (200 mg total) by mouth 3 (three) times daily as needed for cough.   PREDNISONE (DELTASONE) 50 MG TABLET    Take 1 tablet (50 mg total) by mouth daily.     Sherlene Shams, MD 07/24/16 [phone]

## 2016-07-22 NOTE — ED Notes (Signed)
Bed: UC07 Expected date:  Expected time:  Means of arrival:  Comments: 

## 2016-07-22 NOTE — Discharge Instructions (Addendum)
Anticipate gradual improvement in cough, sinus drainage over the next several days.  Cough may take a couple weeks to subside.  Recheck for new fever >100.5, increasing phlegm production/nasal discharge, or if not starting to improve in a few days.  Prescriptions sent to [facility] on [address].

## 2016-07-22 NOTE — ED Triage Notes (Signed)
Pt  reports   Symptoms  Of  Cough    /   Congestion   As   Well  As  Body  Aches  For  Several  Days     Pt   Appears  In no  Acute  Severe  Distress

## 2017-02-06 IMAGING — DX DG CHEST 2V
2 series · 2 of 2 positions shown · non-contrast
Comparison: PA and lateral chest 08/28/2013 and 02/27/2012. CT
chest 06/08/2009.

CLINICAL DATA: Chest pain, cough and congestion for 4 days. Initial
encounter.

EXAM:
CHEST  2 VIEW

[chest pa]
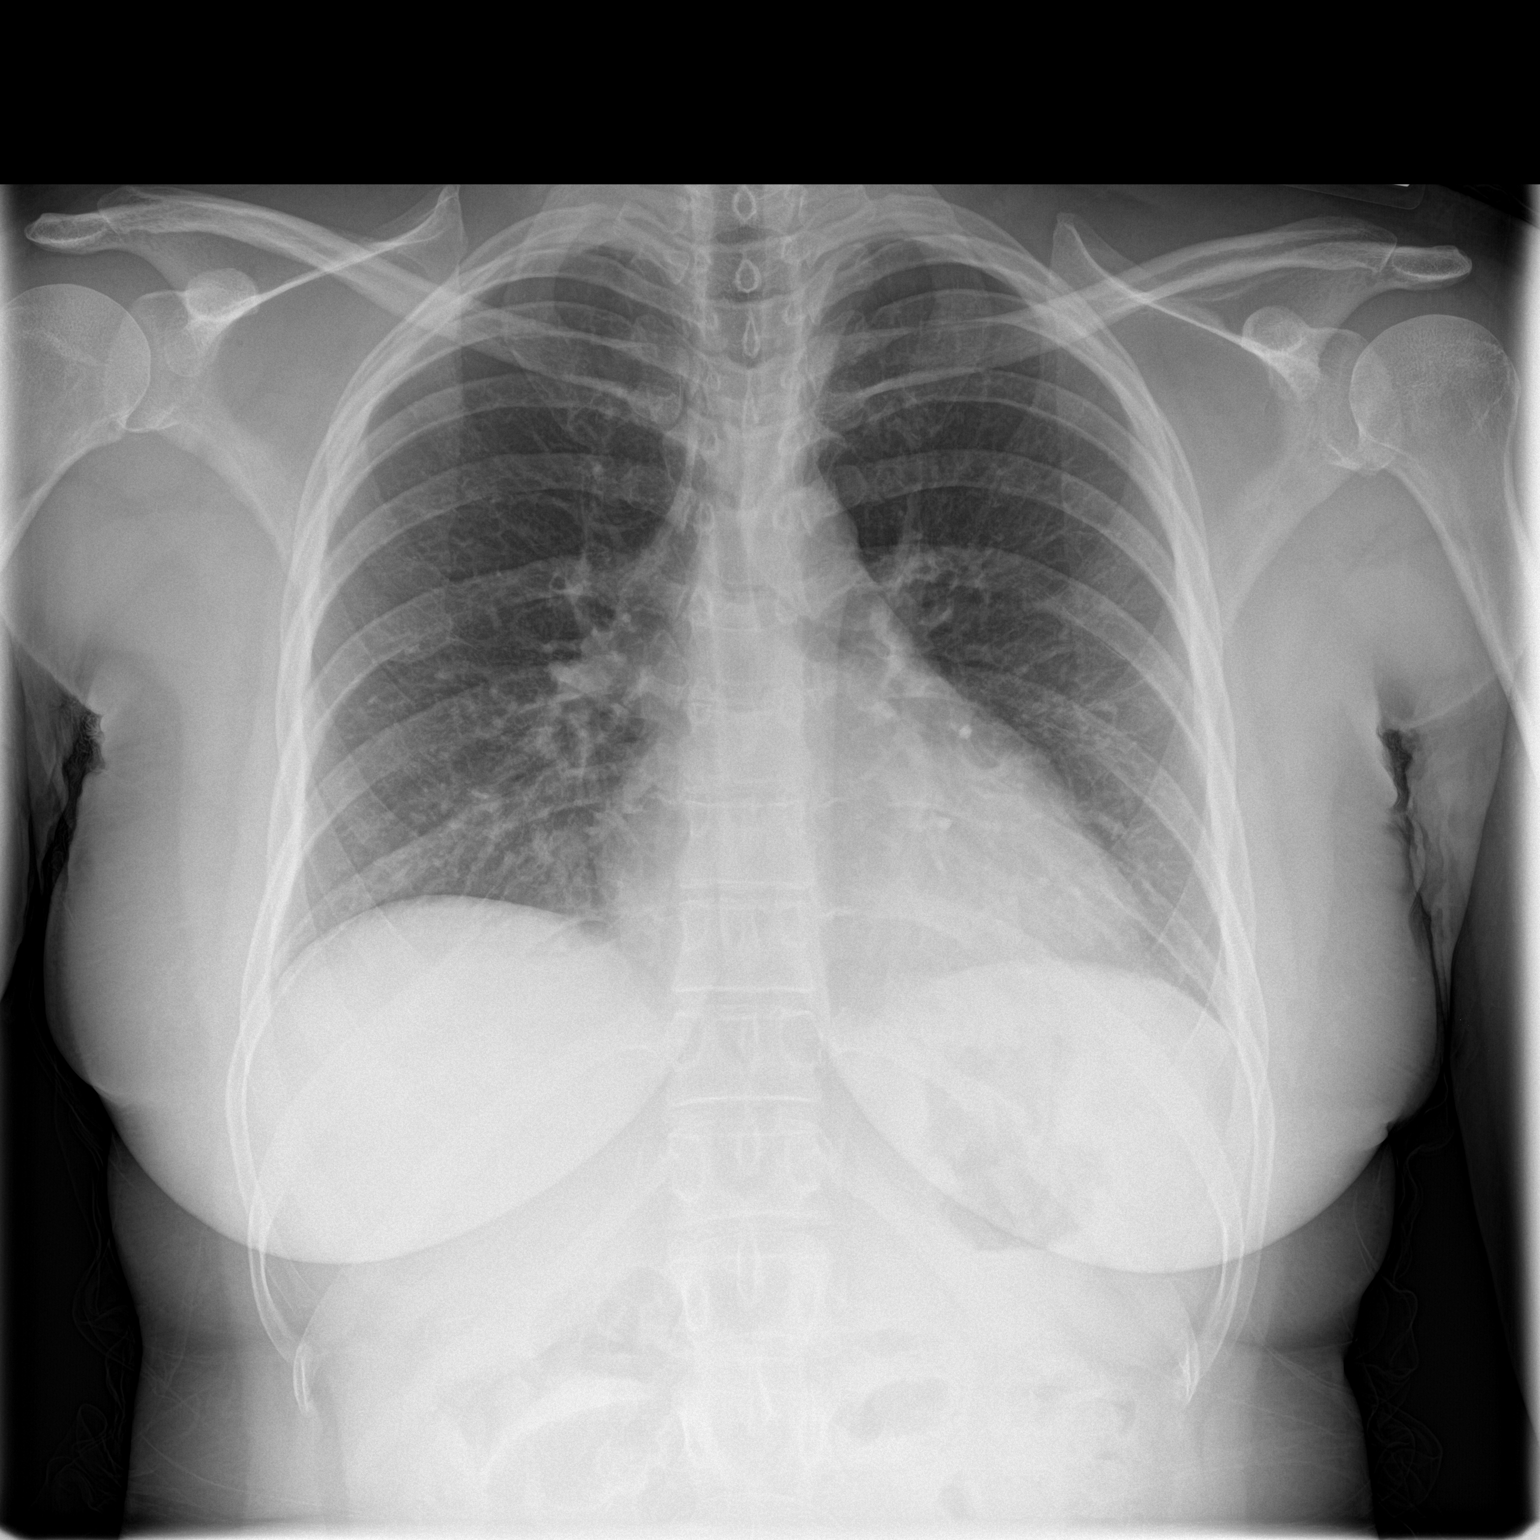

[chest lat]
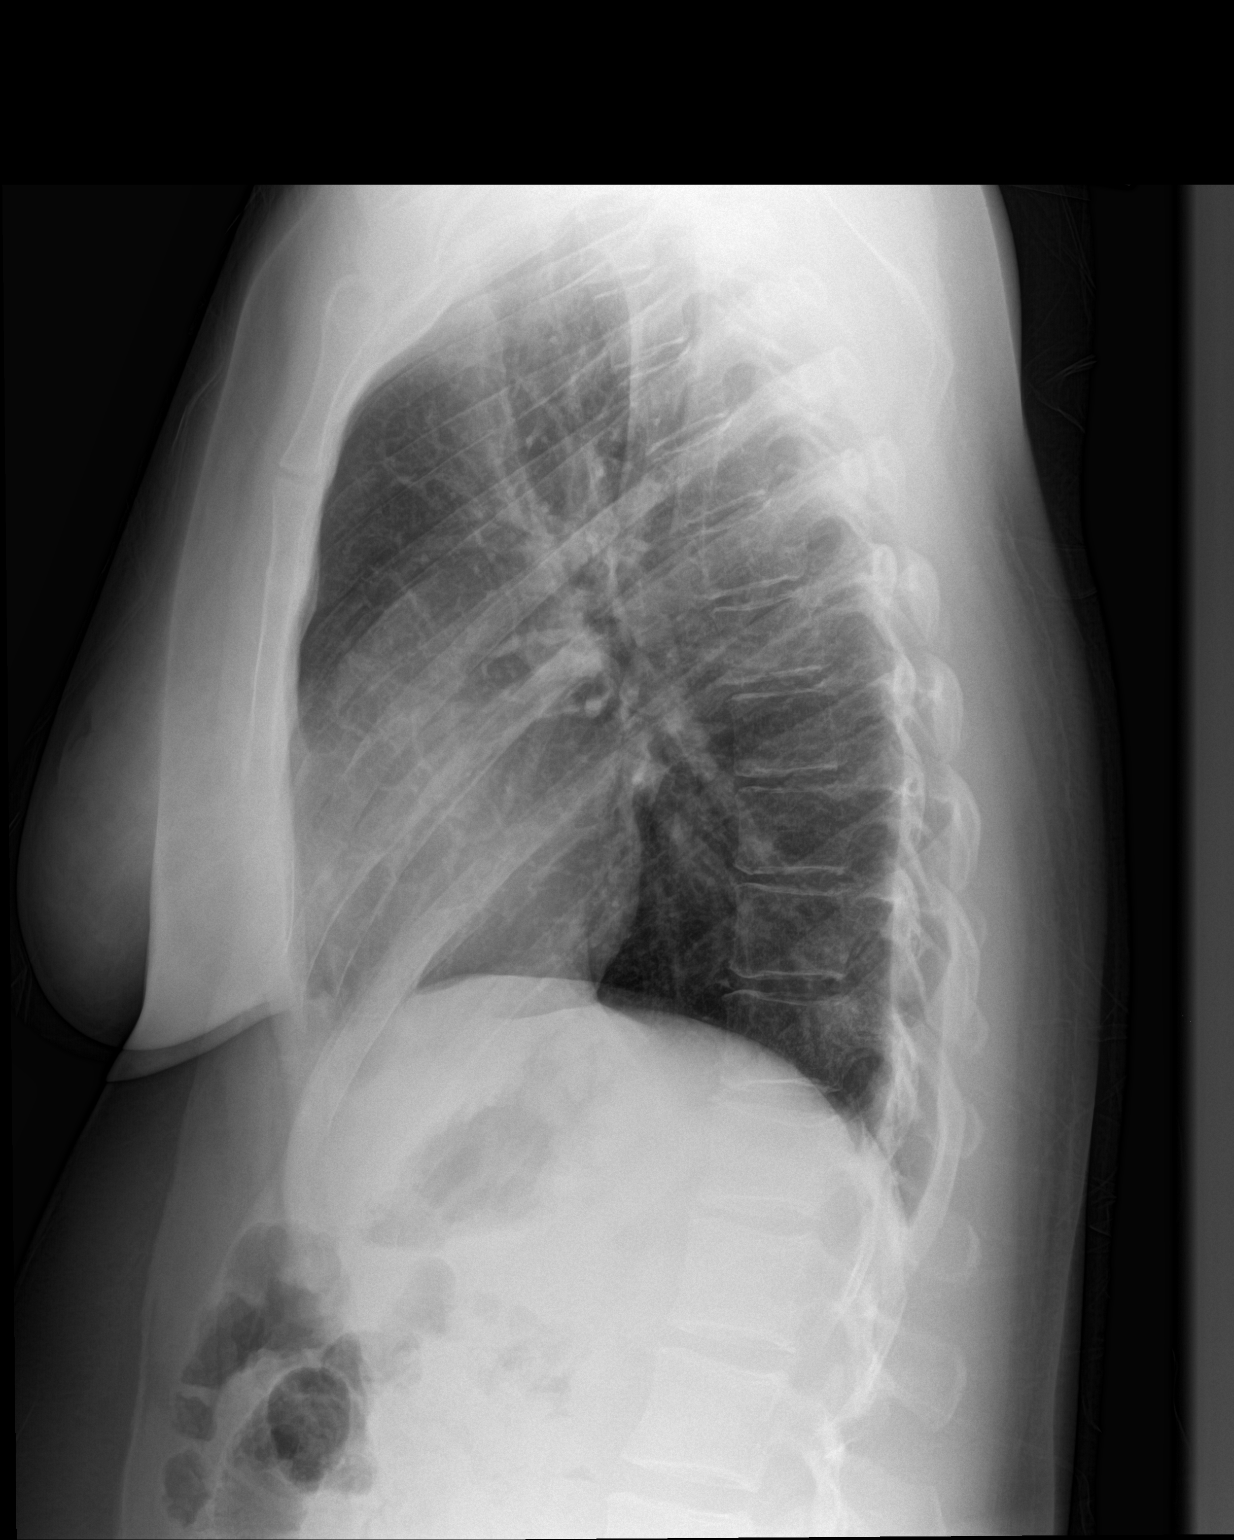

[2 of 2 positions shown; findings below may reference images not displayed]

FINDINGS: The lungs are clear. Heart size is normal. There is no pneumothorax
or pleural effusion. No bony abnormality is identified.
IMPRESSION: Negative chest.

## 2017-12-02 ENCOUNTER — Ambulatory Visit (INDEPENDENT_AMBULATORY_CARE_PROVIDER_SITE_OTHER): Payer: Medicaid Other | Admitting: Obstetrics

## 2017-12-02 ENCOUNTER — Other Ambulatory Visit (HOSPITAL_COMMUNITY)
Admission: RE | Admit: 2017-12-02 | Discharge: 2017-12-02 | Disposition: A | Discharge status: Home / Self Care | Payer: Medicaid Other | Source: Ambulatory Visit | Attending: Obstetrics | Admitting: Obstetrics

## 2017-12-02 ENCOUNTER — Encounter: Payer: Self-pay | Admitting: Obstetrics

## 2017-12-02 VITALS — BP 118/74 | HR 87 | Ht 62.0 in | Wt 168.7 lb

## 2017-12-02 DIAGNOSIS — Z124 Encounter for screening for malignant neoplasm of cervix: Secondary | ICD-10-CM | POA: Diagnosis not present

## 2017-12-02 DIAGNOSIS — N898 Other specified noninflammatory disorders of vagina: Secondary | ICD-10-CM

## 2017-12-02 DIAGNOSIS — R102 Pelvic and perineal pain unspecified side: Secondary | ICD-10-CM

## 2017-12-02 DIAGNOSIS — Z01419 Encounter for gynecological examination (general) (routine) without abnormal findings: Secondary | ICD-10-CM

## 2017-12-02 DIAGNOSIS — Z113 Encounter for screening for infections with a predominantly sexual mode of transmission: Secondary | ICD-10-CM

## 2017-12-02 DIAGNOSIS — Z0000 Encounter for general adult medical examination without abnormal findings: Secondary | ICD-10-CM | POA: Diagnosis not present

## 2017-12-02 DIAGNOSIS — D271 Benign neoplasm of left ovary: Secondary | ICD-10-CM

## 2017-12-02 NOTE — Progress Notes (Signed)
Patient presents for Annual Exam.  Last pap:01/20/2015 LMP:11/12/17 STD Screening: Full panel  Contraception: None CC: cyst on Rt ovary per pt

## 2017-12-02 NOTE — Progress Notes (Addendum)
Subjective:        Tamara Waller is a 37 y.o. female here for a routine exam.  Current complaints: Pelvic pain.  RLQ.  Thinks that she has a cyst on right ovary.  She has a history of a small stable Dermoid of Left Ovary.  Personal health questionnaire:  Is patient Ashkenazi Jewish, have a family history of breast and/or ovarian cancer: no Is there a family history of uterine cancer diagnosed at age < 74, gastrointestinal cancer, urinary tract cancer, family member who is a Field seismologist syndrome-associated carrier: no Is the patient overweight and hypertensive, family history of diabetes, personal history of gestational diabetes, preeclampsia or PCOS: no Is patient over 41, have PCOS,  family history of premature CHD under age 73, diabetes, smoke, have hypertension or peripheral artery disease:  no At any time, has a partner hit, kicked or otherwise hurt or frightened you?: no Over the past 2 weeks, have you felt down, depressed or hopeless?: no Over the past 2 weeks, have you felt little interest or pleasure in doing things?:no   Gynecologic History Patient's last menstrual period was 11/12/2017 (exact date). Contraception: none Last Pap: 2015. Results were: normal Last mammogram: n/a. Results were: n/a  Obstetric History OB History  Gravida Para Term Preterm AB Living  3 3 2 1  0 3  SAB TAB Ectopic Multiple Live Births  0 0 0 0 3    # Outcome Date GA Lbr Len/2nd Weight Sex Delivery Anes PTL Lv  3 Preterm  [redacted]w[redacted]d   M Vag-Spont   LIV  2 Term      Vag-Spont   LIV  1 Term      Vag-Spont   LIV    Past Medical History:  Diagnosis Date  . Asthma   . Chlamydia   . GERD (gastroesophageal reflux disease)   . Pyelonephritis   . Trichomonas   . UTI (urinary tract infection)     Past Surgical History:  Procedure Laterality Date  . CARDIAC SURGERY     as newborn  . HERNIA REPAIR       Current Outpatient Medications:  .  albuterol (PROVENTIL HFA;VENTOLIN HFA) 108 (90 Base)  MCG/ACT inhaler, Inhale 1-2 puffs into the lungs every 4 (four) hours as needed for wheezing or shortness of breath., Disp: 1 Inhaler, Rfl: 0 .  Probiotic Product (PROBIOTIC PO), Take 1 tablet by mouth daily., Disp: , Rfl:  .  acetaminophen (TYLENOL) 325 MG tablet, Take 650 mg by mouth daily as needed for moderate pain., Disp: , Rfl:  .  benzonatate (TESSALON) 200 MG capsule, Take 1 capsule (200 mg total) by mouth 3 (three) times daily as needed for cough. (Patient not taking: Reported on 12/02/2017), Disp: 30 capsule, Rfl: 1 .  fluticasone (FLONASE) 50 MCG/ACT nasal spray, Place 2 sprays into both nostrils daily. (Patient not taking: Reported on 12/02/2017), Disp: 16 g, Rfl: 0 .  guaiFENesin (ROBITUSSIN) 100 MG/5ML liquid, Take 5-10 mLs (100-200 mg total) by mouth every 4 (four) hours as needed for cough. (Patient not taking: Reported on 12/02/2017), Disp: 60 mL, Rfl: 0 .  ibuprofen (ADVIL,MOTRIN) 800 MG tablet, Take 1 tablet (800 mg total) by mouth every 8 (eight) hours as needed for mild pain or moderate pain. (Patient not taking: Reported on 12/02/2017), Disp: 15 tablet, Rfl: 0 .  medroxyPROGESTERone (DEPO-PROVERA) 150 MG/ML injection, Inject 1 mL (150 mg total) into the muscle every 3 (three) months. (Patient not taking: Reported on 12/02/2017), Disp: 1  mL, Rfl: 3 .  naproxen (NAPROSYN) 500 MG tablet, Take 1 tablet (500 mg total) by mouth 2 (two) times daily. (Patient not taking: Reported on 12/02/2017), Disp: 30 tablet, Rfl: 0 .  orphenadrine (NORFLEX) 100 MG tablet, Take 1 tablet (100 mg total) by mouth 2 (two) times daily. (Patient not taking: Reported on 12/02/2017), Disp: 30 tablet, Rfl: 0 .  predniSONE (DELTASONE) 50 MG tablet, Take 1 tablet (50 mg total) by mouth daily. (Patient not taking: Reported on 12/02/2017), Disp: 3 tablet, Rfl: 0 No Known Allergies  Social History   Tobacco Use  . Smoking status: Never Smoker  . Smokeless tobacco: Never Used  Substance Use Topics  . Alcohol use: No     Alcohol/week: 0.0 oz    Family History  Problem Relation Age of Onset  . Schizophrenia Mother       Review of Systems  Constitutional: negative for fatigue and weight loss Respiratory: negative for cough and wheezing Cardiovascular: negative for chest pain, fatigue and palpitations Gastrointestinal: negative for abdominal pain and change in bowel habits Musculoskeletal:negative for myalgias Neurological: negative for gait problems and tremors Behavioral/Psych: negative for abusive relationship, depression Endocrine: negative for temperature intolerance    Genitourinary:POSITIVE for pelvic pain - RLQ.  Denies abnormal periods or dysuria. Integument/breast: negative for breast lump, breast tenderness, nipple discharge and skin lesion(s)    Objective:       BP 118/74   Pulse 87   Ht 5\' 2"  (1.575 m)   Wt 168 lb 11.2 oz (76.5 kg)   LMP 11/12/2017 (Exact Date)   BMI 30.86 kg/m  General:   alert  Skin:   no rash or abnormalities  Lungs:   clear to auscultation bilaterally  Heart:   regular rate and rhythm, S1, S2 normal, no murmur, click, rub or gallop  Breasts:   normal without suspicious masses, skin or nipple changes or axillary nodes  Abdomen:  normal findings: no organomegaly, soft, non-tender and no hernia  Pelvis:  External genitalia: normal general appearance Urinary system: urethral meatus normal and bladder without fullness, nontender Vaginal: normal without tenderness, induration or masses Cervix: normal appearance Adnexa: normal bimanual exam Uterus: anteverted and non-tender, normal size   Lab Review Urine pregnancy test Labs reviewed yes Radiologic studies reviewed yes  50% of 20 min visit spent on counseling and coordination of care.   Assessment:     1. Women's annual routine gynecological examination  2. Dermoid cyst of left ovary - stable, small size on follow up ultrasounds  3. Pelvic pain Rx: - US PELVIC COMPLETE WITH TRANSVAGINAL;  Future  4. Vaginal discharge Rx: - Cervicovaginal ancillary only  5. Screening for cervical cancer Rx: - Cytology - PAP  6. Screening for STD (sexually transmitted disease) Rx: - Hepatitis B surface antigen - Hepatitis C antibody - RPR - HIV antibody   Plan:    Education reviewed: calcium supplements, depression evaluation, low fat, low cholesterol diet, safe sex/STD prevention, self breast exams, weight bearing exercise and walking. Contraception: none. Follow up in: 1 year.  Will call with ultrasound results.  No orders of the defined types were placed in this encounter.  Orders Placed This Encounter  Procedures  . US PELVIC COMPLETE WITH TRANSVAGINAL    Standing Status:   Future    Standing Expiration Date:   02/03/2019    Order Specific Question:   Reason for Exam (SYMPTOM  OR DIAGNOSIS REQUIRED)    Answer:   Pelvic pain.  RLQ.  History of small stable Dermoid of Left Ovary.    Order Specific Question:   Preferred imaging location?    Answer:   [facility]  . Hepatitis B surface antigen  . Hepatitis C antibody  . RPR  . HIV antibody    [name] MD 12-02-2017

## 2017-12-03 LAB — CYTOLOGY - PAP
Diagnosis: NEGATIVE
HPV: NOT DETECTED

## 2017-12-03 LAB — CERVICOVAGINAL ANCILLARY ONLY
Bacterial vaginitis: POSITIVE — AB
Candida vaginitis: NEGATIVE
Chlamydia: NEGATIVE
Neisseria Gonorrhea: NEGATIVE
Trichomonas: NEGATIVE

## 2017-12-03 LAB — RPR: RPR: NONREACTIVE

## 2017-12-03 LAB — HIV ANTIBODY (ROUTINE TESTING W REFLEX): HIV Screen 4th Generation wRfx: NONREACTIVE

## 2017-12-03 LAB — HEPATITIS B SURFACE ANTIGEN: HEP B S AG: NEGATIVE

## 2017-12-03 LAB — HEPATITIS C ANTIBODY

## 2017-12-04 ENCOUNTER — Other Ambulatory Visit: Payer: Self-pay | Admitting: Obstetrics

## 2017-12-04 DIAGNOSIS — B9689 Other specified bacterial agents as the cause of diseases classified elsewhere: Secondary | ICD-10-CM

## 2017-12-04 DIAGNOSIS — N760 Acute vaginitis: Principal | ICD-10-CM

## 2017-12-04 MED ORDER — SECNIDAZOLE 2 G PO PACK: 1.0000 | PACK | Freq: Once | ORAL | 2 refills | Status: AC

## 2017-12-06 ENCOUNTER — Ambulatory Visit (HOSPITAL_COMMUNITY): Payer: Medicaid Other

## 2017-12-11 ENCOUNTER — Encounter (HOSPITAL_COMMUNITY): Payer: Self-pay | Admitting: *Deleted

## 2017-12-11 ENCOUNTER — Emergency Department (HOSPITAL_COMMUNITY): Payer: Medicaid Other

## 2017-12-11 ENCOUNTER — Emergency Department (HOSPITAL_COMMUNITY)
Admission: EM | Admit: 2017-12-11 | Discharge: 2017-12-11 | Disposition: A | Discharge status: Home / Self Care | Payer: Medicaid Other | Attending: Emergency Medicine | Admitting: Emergency Medicine

## 2017-12-11 DIAGNOSIS — R0789 Other chest pain: Secondary | ICD-10-CM | POA: Diagnosis not present

## 2017-12-11 LAB — CBC
HCT: 41.9 % (ref 36.0–46.0)
Hemoglobin: 14.5 g/dL (ref 12.0–15.0)
MCH: 30.2 pg (ref 26.0–34.0)
MCHC: 34.6 g/dL (ref 30.0–36.0)
MCV: 87.3 fL (ref 78.0–100.0)
Platelets: 365 10*3/uL (ref 150–400)
RBC: 4.8 MIL/uL (ref 3.87–5.11)
RDW: 12.6 % (ref 11.5–15.5)
WBC: 4.9 10*3/uL (ref 4.0–10.5)

## 2017-12-11 LAB — BASIC METABOLIC PANEL
Anion gap: 8 (ref 5–15)
BUN: 11 mg/dL (ref 6–20)
CO2: 24 mmol/L (ref 22–32)
CREATININE: 0.77 mg/dL (ref 0.44–1.00)
Calcium: 9.7 mg/dL (ref 8.9–10.3)
Chloride: 109 mmol/L (ref 98–111)
GFR calc Af Amer: >60 mL/min (ref >60)
GLUCOSE: 74 mg/dL (ref 70–99)
Potassium: 4.1 mmol/L (ref 3.5–5.1)
Sodium: 141 mmol/L (ref 135–145)

## 2017-12-11 LAB — I-STAT BETA HCG BLOOD, ED (MC, WL, AP ONLY): I-stat hCG, quantitative: <5 m[IU]/mL (ref <5)

## 2017-12-11 LAB — I-STAT TROPONIN, ED: Troponin i, poc: 0 ng/mL (ref 0.00–0.08)

## 2017-12-11 MED ORDER — HYDROCODONE-ACETAMINOPHEN 5-325 MG PO TABS
1.0000 | ORAL_TABLET | Freq: Once | ORAL | Status: AC
  Administered 2017-12-11: 1 via ORAL
  Filled 2017-12-11: qty 1

## 2017-12-11 MED ORDER — IBUPROFEN 800 MG PO TABS: 800.0000 mg | ORAL_TABLET | Freq: Three times a day (TID) | ORAL | 0 refills | Status: DC | PRN

## 2017-12-11 NOTE — ED Provider Notes (Signed)
[facility] DEPT Provider Note   CSN: [id number] Arrival date & time: 12/11/17  1143     History   Chief Complaint Chief Complaint  Patient presents with  . Chest Pain    HPI Tamara Waller is a 37 y.o. female.  Patient complains of left-sided chest pain  It hurts worse when she is lifting.  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 No language interpreter was used.  Chest Pain   This is a new problem. The current episode started less than 1 hour ago. The problem occurs constantly. The problem has not changed since onset.The pain is associated with exertion. Pain location: Left chest wall. The pain is at a severity of 5/10. The pain is moderate. The quality of the pain is described as dull. The pain does not radiate. Exacerbated by: Movement. Pertinent negatives include no abdominal pain, no back pain, no cough and no headaches.  Pertinent negatives for past medical history include no seizures.    Past Medical History:  Diagnosis Date  . Asthma   . Chlamydia   . GERD (gastroesophageal reflux disease)   . Pyelonephritis   . Trichomonas   . UTI (urinary tract infection)     Patient Active Problem List   Diagnosis Date Noted  . UTI symptoms 10/14/2013  . Constipation, chronic 10/14/2013  . Urinary tract infection, site not specified 08/04/2013  . Toenail fungus 02/17/2013  . Other and unspecified ovarian cyst 11/28/2012  . BV (bacterial vaginosis) 11/28/2012  . Dermatitis 11/28/2012  . 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 Vaginitis and vulvovaginitis, unspecified 10/23/2012    Past Surgical History:  Procedure Laterality Date  . CARDIAC SURGERY     as newborn  . HERNIA REPAIR       OB History    Gravida  3   Para  3   Term  2   Preterm  1   AB  0   Living  3     SAB  0   TAB  0   Ectopic  0   Multiple  0   Live Births  3            Home Medications     Prior to Admission medications   Medication Sig Start Date End Date Taking? Authorizing Provider  docusate sodium (COLACE) 100 MG capsule Take 100 mg by mouth daily.   Yes [provider]  fluticasone (FLONASE) 50 MCG/ACT nasal spray Place 2 sprays into both nostrils daily. 08/08/15  Yes West, Emily, PA-C  albuterol (PROVENTIL HFA;VENTOLIN HFA) 108 (90 Base) MCG/ACT inhaler Inhale 1-2 puffs into the lungs every 4 (four) hours as needed for wheezing or shortness of breath. Patient not taking: Reported on 12/11/2017 07/22/16   [name], MD  benzonatate (TESSALON) 200 MG capsule Take 1 capsule (200 mg total) by mouth 3 (three) times daily as needed for cough. Patient not taking: Reported on 12/02/2017 07/22/16   [name], MD  guaiFENesin (ROBITUSSIN) 100 MG/5ML liquid Take 5-10 mLs (100-200 mg total) by mouth every 4 (four) hours as needed for cough. Patient not taking: Reported on 12/02/2017 08/08/15   [name], PA-C  ibuprofen (ADVIL,MOTRIN) 800 MG tablet Take 1 tablet (800 mg total) by mouth every 8 (eight) hours as needed. 12/11/17   Milton Ferguson, MD  medroxyPROGESTERone (DEPO-PROVERA) 150 MG/ML injection Inject 1 mL (150 mg total) into the  muscle every 3 (three) months. Patient not taking: Reported on 12/02/2017 01/20/15   [name], MD  naproxen (NAPROSYN) 500 MG tablet Take 1 tablet (500 mg total) by mouth 2 (two) times daily. Patient not taking: Reported on 12/02/2017 06/18/15   Charlesetta Shanks, MD  orphenadrine (NORFLEX) 100 MG tablet Take 1 tablet (100 mg total) by mouth 2 (two) times daily. Patient not taking: Reported on 12/02/2017 06/18/15   Charlesetta Shanks, MD  predniSONE (DELTASONE) 50 MG tablet Take 1 tablet (50 mg total) by mouth daily. Patient not taking: Reported on 12/02/2017 07/22/16   [name], MD    Family History Family History  Problem Relation Age of Onset  . Schizophrenia Mother     Social History Social History   Tobacco  Use  . Smoking status: Never Smoker  . Smokeless tobacco: Never Used  Substance Use Topics  . Alcohol use: No    Alcohol/week: 0.0 oz  . Drug use: No     Allergies   Patient has no known allergies.   Review of Systems Review of Systems  Constitutional: Negative for appetite change and fatigue.  HENT: Negative for congestion, ear discharge and sinus pressure.   Eyes: Negative for discharge.  Respiratory: Negative for cough.   Cardiovascular: Positive for chest pain.  Gastrointestinal: Negative for abdominal pain and diarrhea.  Genitourinary: Negative for frequency and hematuria.  Musculoskeletal: Negative for back pain.  Skin: Negative for rash.  Neurological: Negative for seizures and headaches.  Psychiatric/Behavioral: Negative for hallucinations.     Physical Exam Updated Vital Signs BP 104/60   Pulse 85   Temp (!) 97.5 F (36.4 C) (Oral)   Resp (!) 23   LMP 11/12/2017 (Exact Date)   SpO2 99%   Physical Exam  Constitutional: She is oriented to person, place, and time. She appears well-developed.  HENT:  Head: Normocephalic.  Eyes: Conjunctivae and EOM are normal. No scleral icterus.  Neck: Neck supple. No thyromegaly present.  Cardiovascular: Normal rate and regular rhythm. Exam reveals no gallop and no friction rub.  No murmur heard. Tender left chest wall  Pulmonary/Chest: No stridor. She has no wheezes. She has no rales. She exhibits no tenderness.  Abdominal: She exhibits no distension. There is no tenderness. There is no rebound.  Musculoskeletal: Normal range of motion. She exhibits no edema.  Lymphadenopathy:    She has no cervical adenopathy.  Neurological: She is oriented to person, place, and time. She exhibits normal muscle tone. Coordination normal.  Skin: No rash noted. No erythema.  Psychiatric: She has a normal mood and affect. Her behavior is normal.     ED Treatments / Results  Labs (all labs ordered are listed, but only abnormal  results are displayed) Labs Reviewed  BASIC METABOLIC PANEL  CBC  I-STAT TROPONIN, ED  I-STAT BETA HCG BLOOD, ED ([facility], [facility], [facility] ONLY)    EKG EKG Interpretation  Date/Time:  Wednesday December 11 2017 11:52:18 EDT Ventricular Rate:  73 PR Interval:    QRS Duration: 82 QT Interval:  374 QTC Calculation: 413 R Axis:   71 Text Interpretation:  Sinus rhythm Probable left atrial enlargement Baseline wander in lead(s) II V2 Confirmed by Milton Ferguson [phone]) on 12/11/2017 1:59:23 PM   Radiology Dg Chest 2 View  Result Date: 12/11/2017 CLINICAL DATA:  Chest pain EXAM: CHEST - 2 VIEW COMPARISON:  August 08, 2015 FINDINGS: Lungs are clear. Heart size and pulmonary vascularity are normal. No adenopathy. No pneumothorax. No bone  lesions. IMPRESSION: No edema or consolidation. Electronically Signed   By: Lowella Grip III M.D.   On: 12/11/2017 13:05    Procedures Procedures (including critical care time)  Medications Ordered in ED Medications  HYDROcodone-acetaminophen (NORCO/VICODIN) 5-325 MG per tablet 1 tablet (1 tablet Oral Given 12/11/17 1227)     Initial Impression / Assessment and Plan / ED Course  I have reviewed the triage vital signs and the nursing notes.  Pertinent labs & imaging results that were available during my care of the patient were reviewed by me and considered in my medical decision making (see chart for details).    CBC chemistries chest x-ray troponin and EKG all unremarkable.  Diagnosis chest wall pain.  Patient given Motrin 800 mg to be taken for discomfort and will follow-up with PCP as needed  Final Clinical Impressions(s) / ED Diagnoses   Final diagnoses:  Chest wall pain    ED Discharge Orders        Ordered    ibuprofen (ADVIL,MOTRIN) 800 MG tablet  Every 8 hours PRN     12/11/17 1403       Milton Ferguson, MD 12/11/17 1409

## 2017-12-11 NOTE — ED Triage Notes (Signed)
Pt complains of left sided chest pain since last night. Pain s worse with movement. Pt denies shortness of breath or nausea.

## 2017-12-11 NOTE — ED Notes (Signed)
ED Provider at bedside. 

## 2017-12-11 NOTE — Discharge Instructions (Addendum)
Follow-up with your family doctor if any problems no lifting over couple pounds for a week

## 2017-12-12 ENCOUNTER — Ambulatory Visit (HOSPITAL_COMMUNITY): Admission: RE | Admit: 2017-12-12 | Payer: Medicaid Other | Source: Ambulatory Visit

## 2017-12-16 ENCOUNTER — Ambulatory Visit: Payer: Medicaid Other | Admitting: Obstetrics

## 2017-12-19 ENCOUNTER — Ambulatory Visit: Payer: Medicaid Other | Admitting: Obstetrics

## 2018-01-27 ENCOUNTER — Ambulatory Visit (HOSPITAL_COMMUNITY): Admission: RE | Admit: 2018-01-27 | Payer: Medicaid Other | Source: Ambulatory Visit

## 2018-04-28 ENCOUNTER — Encounter (HOSPITAL_COMMUNITY): Payer: Self-pay | Admitting: Emergency Medicine

## 2018-04-28 ENCOUNTER — Emergency Department (HOSPITAL_COMMUNITY)
Admission: EM | Admit: 2018-04-28 | Discharge: 2018-04-28 | Disposition: A | Discharge status: Home / Self Care | Payer: No Typology Code available for payment source | Attending: Emergency Medicine | Admitting: Emergency Medicine

## 2018-04-28 DIAGNOSIS — Y92410 Unspecified street and highway as the place of occurrence of the external cause: Secondary | ICD-10-CM | POA: Diagnosis not present

## 2018-04-28 DIAGNOSIS — Y9389 Activity, other specified: Secondary | ICD-10-CM | POA: Insufficient documentation

## 2018-04-28 DIAGNOSIS — M549 Dorsalgia, unspecified: Secondary | ICD-10-CM | POA: Diagnosis not present

## 2018-04-28 DIAGNOSIS — J45909 Unspecified asthma, uncomplicated: Secondary | ICD-10-CM | POA: Insufficient documentation

## 2018-04-28 DIAGNOSIS — Z79899 Other long term (current) drug therapy: Secondary | ICD-10-CM | POA: Diagnosis not present

## 2018-04-28 DIAGNOSIS — R51 Headache: Secondary | ICD-10-CM | POA: Diagnosis not present

## 2018-04-28 DIAGNOSIS — M545 Low back pain: Secondary | ICD-10-CM | POA: Diagnosis not present

## 2018-04-28 DIAGNOSIS — M542 Cervicalgia: Secondary | ICD-10-CM | POA: Insufficient documentation

## 2018-04-28 DIAGNOSIS — Y999 Unspecified external cause status: Secondary | ICD-10-CM | POA: Diagnosis not present

## 2018-04-28 MED ORDER — METHOCARBAMOL 500 MG PO TABS: 500.0000 mg | ORAL_TABLET | Freq: Two times a day (BID) | ORAL | 0 refills | Status: DC

## 2018-04-28 MED ORDER — NAPROXEN 500 MG PO TABS: 500.0000 mg | ORAL_TABLET | Freq: Two times a day (BID) | ORAL | 0 refills | Status: DC

## 2018-04-28 MED ORDER — KETOROLAC TROMETHAMINE 30 MG/ML IJ SOLN
30.0000 mg | Freq: Once | INTRAMUSCULAR | Status: AC
  Administered 2018-04-28: 30 mg via INTRAMUSCULAR
  Filled 2018-04-28: qty 1
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
## 2018-04-28 NOTE — ED Notes (Signed)
ED Provider at bedside. 

## 2018-04-28 NOTE — ED Notes (Signed)
Pt verbalizes understanding of d/c instructions. Prescriptions reviewed with patient. Pt ambulatory at d/c with all belongings and with family.   
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
## 2018-05-29 ENCOUNTER — Encounter: Payer: Self-pay | Admitting: Obstetrics

## 2018-05-29 ENCOUNTER — Ambulatory Visit (INDEPENDENT_AMBULATORY_CARE_PROVIDER_SITE_OTHER): Payer: Medicaid Other | Admitting: Obstetrics

## 2018-05-29 VITALS — BP 118/80 | HR 70 | Wt 166.2 lb

## 2018-05-29 DIAGNOSIS — Z3009 Encounter for other general counseling and advice on contraception: Secondary | ICD-10-CM | POA: Diagnosis not present

## 2018-05-29 DIAGNOSIS — Z30013 Encounter for initial prescription of injectable contraceptive: Secondary | ICD-10-CM | POA: Diagnosis not present

## 2018-05-29 DIAGNOSIS — D271 Benign neoplasm of left ovary: Secondary | ICD-10-CM | POA: Diagnosis not present

## 2018-05-29 LAB — POCT URINE PREGNANCY: Preg Test, Ur: NEGATIVE

## 2018-05-29 MED ORDER — MEDROXYPROGESTERONE ACETATE 150 MG/ML IM SUSP
150.0000 mg | Freq: Once | INTRAMUSCULAR | Status: AC
  Administered 2018-05-29: 150 mg via INTRAMUSCULAR

## 2018-05-29 MED ORDER — MEDROXYPROGESTERONE ACETATE 150 MG/ML IM SUSP: 150.0000 mg | INTRAMUSCULAR | 4 refills | Status: DC

## 2018-05-29 NOTE — Progress Notes (Signed)
Subjective:    Tamara Waller is a 38 y.o. female who presents for contraception counseling. The patient has no complaints today. The patient is sexually active. Pertinent past medical history: none.  The information documented in the HPI was reviewed and verified.  Menstrual History: OB History    Gravida  3   Para  3   Term  2   Preterm  1   AB  0   Living  3     SAB  0   TAB  0   Ectopic  0   Multiple  0   Live Births  3            Patient's last menstrual period was 05/12/2018 (exact date).   Patient Active Problem List   Diagnosis Date Noted  . UTI symptoms 10/14/2013  . Constipation, chronic 10/14/2013  . Urinary tract infection, site not specified 08/04/2013  . Toenail fungus 02/17/2013  . Other and unspecified ovarian cyst 11/28/2012  . BV (bacterial vaginosis) 11/28/2012  . Dermatitis 11/28/2012  . 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 Vaginitis and vulvovaginitis, unspecified 10/23/2012   Past Medical History:  Diagnosis Date  . Asthma   . Chlamydia   . GERD (gastroesophageal reflux disease)   . Pyelonephritis   . Trichomonas   . UTI (urinary tract infection)     Past Surgical History:  Procedure Laterality Date  . CARDIAC SURGERY     as newborn  . HERNIA REPAIR       Current Outpatient Medications:  .  albuterol (PROVENTIL HFA;VENTOLIN HFA) 108 (90 Base) MCG/ACT inhaler, Inhale 1-2 puffs into the lungs every 4 (four) hours as needed for wheezing or shortness of breath. (Patient not taking: Reported on 12/11/2017), Disp: 1 Inhaler, Rfl: 0 .  benzonatate (TESSALON) 200 MG capsule, Take 1 capsule (200 mg total) by mouth 3 (three) times daily as needed for cough. (Patient not taking: Reported on 12/02/2017), Disp: 30 capsule, Rfl: 1 .  docusate sodium (COLACE) 100 MG capsule, Take 100 mg by mouth daily., Disp: , Rfl:  .  fluticasone (FLONASE) 50 MCG/ACT nasal spray, Place 2 sprays into both nostrils daily. (Patient not taking: Reported on  05/29/2018), Disp: 16 g, Rfl: 0 .  guaiFENesin (ROBITUSSIN) 100 MG/5ML liquid, Take 5-10 mLs (100-200 mg total) by mouth every 4 (four) hours as needed for cough. (Patient not taking: Reported on 12/02/2017), Disp: 60 mL, Rfl: 0 .  ibuprofen (ADVIL,MOTRIN) 800 MG tablet, Take 1 tablet (800 mg total) by mouth every 8 (eight) hours as needed. (Patient not taking: Reported on 05/29/2018), Disp: 20 tablet, Rfl: 0 .  medroxyPROGESTERone (DEPO-PROVERA) 150 MG/ML injection, Inject 1 mL (150 mg total) into the muscle every 3 (three) months. (Patient not taking: Reported on 12/02/2017), Disp: 1 mL, Rfl: 3 .  medroxyPROGESTERone (DEPO-PROVERA) 150 MG/ML injection, Inject 1 mL (150 mg total) into the muscle every 3 (three) months., Disp: 1 mL, Rfl: 4 .  methocarbamol (ROBAXIN) 500 MG tablet, Take 1 tablet (500 mg total) by mouth 2 (two) times daily. (Patient not taking: Reported on 05/29/2018), Disp: 20 tablet, Rfl: 0 .  naproxen (NAPROSYN) 500 MG tablet, Take 1 tablet (500 mg total) by mouth 2 (two) times daily. (Patient not taking: Reported on 05/29/2018), Disp: 30 tablet, Rfl: 0 .  orphenadrine (NORFLEX) 100 MG tablet, Take 1 tablet (100 mg total) by mouth 2 (two) times daily. (Patient not taking: Reported on 12/02/2017), Disp: 30 tablet, Rfl: 0 .  predniSONE (  DELTASONE) 50 MG tablet, Take 1 tablet (50 mg total) by mouth daily. (Patient not taking: Reported on 12/02/2017), Disp: 3 tablet, Rfl: 0 No Known Allergies  Social History   Tobacco Use  . Smoking status: Never Smoker  . Smokeless tobacco: Never Used  Substance Use Topics  . Alcohol use: No    Alcohol/week: 0.0 standard drinks    Family History  Problem Relation Age of Onset  . Schizophrenia Mother        Review of Systems Constitutional: negative for weight loss Genitourinary:negative for abnormal menstrual periods and vaginal discharge   Objective:   BP 118/80   Pulse 70   Wt 166 lb 3.2 oz (75.4 kg)   LMP 05/12/2018 (Exact Date)   BMI 30.40  kg/m    PE:  Deferred  Lab Review Urine pregnancy test Labs reviewed yes Radiologic studies reviewed yes  >50% of 15 min visit spent on counseling and coordination of care.    Assessment:    38 y.o., starting Depo-Provera injections, no contraindications.   Plan:   1. Encounter for other general counseling and advice on contraception - wants Depo Provera  2. Encounter for initial prescription of injectable contraceptive Rx: - medroxyPROGESTERone (DEPO-PROVERA) 150 MG/ML injection; Inject 1 mL (150 mg total) into the muscle every 3 (three) months.  Dispense: 1 mL; Refill: 4  3. Dermoid cyst of left ovary.  Small - 2 cm size, asymptomatic.  Followed by yearly pelvic ultrasounds. Rx: - US PELVIC COMPLETE WITH TRANSVAGINAL; Future    All questions answered. Contraception: Depo-Provera injections. Discussed healthy lifestyle modifications. Follow up in 3 months. Pregnancy test, result: negative.    Meds ordered this encounter  Medications  . medroxyPROGESTERone (DEPO-PROVERA) 150 MG/ML injection    Sig: Inject 1 mL (150 mg total) into the muscle every 3 (three) months.    Dispense:  1 mL    Refill:  4   Orders Placed This Encounter  Procedures  . US PELVIC COMPLETE WITH TRANSVAGINAL    Standing Status:   Future    Standing Expiration Date:   07/28/2019    Order Specific Question:   Reason for Exam (SYMPTOM  OR DIAGNOSIS REQUIRED)    Answer:   Follow up.  Dermoid cyst.    Order Specific Question:   Preferred imaging location?    Answer:   [facility] Simona Huh MD 05-29-2018

## 2018-05-29 NOTE — Progress Notes (Signed)
Pt is here to discuss birth control options

## 2018-05-29 NOTE — Addendum Note (Signed)
Addended byCleotilde Neer on: 05/29/2018 09:35 AM   Modules accepted: Orders

## 2018-06-04 ENCOUNTER — Ambulatory Visit (HOSPITAL_COMMUNITY)
Admission: RE | Admit: 2018-06-04 | Discharge: 2018-06-04 | Disposition: A | Discharge status: Home / Self Care | Payer: Medicaid Other | Source: Ambulatory Visit | Attending: Obstetrics | Admitting: Obstetrics

## 2018-06-04 DIAGNOSIS — D271 Benign neoplasm of left ovary: Secondary | ICD-10-CM | POA: Diagnosis present

## 2019-05-25 ENCOUNTER — Ambulatory Visit (HOSPITAL_COMMUNITY)
Admission: EM | Admit: 2019-05-25 | Discharge: 2019-05-25 | Disposition: A | Discharge status: Home / Self Care | Payer: Medicaid Other | Attending: Family Medicine | Admitting: Family Medicine

## 2019-05-25 ENCOUNTER — Encounter (HOSPITAL_COMMUNITY): Payer: Self-pay

## 2019-05-25 ENCOUNTER — Other Ambulatory Visit: Payer: Self-pay

## 2019-05-25 DIAGNOSIS — U071 COVID-19: Secondary | ICD-10-CM | POA: Diagnosis not present

## 2019-05-25 DIAGNOSIS — Z20822 Contact with and (suspected) exposure to covid-19: Secondary | ICD-10-CM | POA: Diagnosis present

## 2019-05-25 DIAGNOSIS — J069 Acute upper respiratory infection, unspecified: Secondary | ICD-10-CM | POA: Diagnosis present

## 2019-05-25 NOTE — Discharge Instructions (Signed)
Self isolate until covid results are back and negative.  Will notify you by phone of any positive findings. Your negative results will be sent through your MyChart.     Push fluids to ensure adequate hydration and keep secretions thin.  Tylenol and/or ibuprofen as needed for pain or fevers.  Over the counter medications as needed for symptoms.  Please return to be seen for any significant worsening of symptoms.
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
## 2019-05-25 NOTE — ED Triage Notes (Signed)
Pt presents to UC for COVID test after exposure 8 days ago. Pt states having chills x 5 days.

## 2019-05-27 LAB — NOVEL CORONAVIRUS, NAA (HOSP ORDER, SEND-OUT TO REF LAB; TAT 18-24 HRS): SARS-CoV-2, NAA: DETECTED — AB

## 2019-05-29 ENCOUNTER — Telehealth: Payer: Self-pay | Admitting: Emergency Medicine

## 2019-05-29 NOTE — Telephone Encounter (Signed)

## 2019-07-16 ENCOUNTER — Encounter (HOSPITAL_COMMUNITY): Payer: Self-pay

## 2019-07-16 ENCOUNTER — Ambulatory Visit (HOSPITAL_COMMUNITY)
Admission: EM | Admit: 2019-07-16 | Discharge: 2019-07-16 | Disposition: A | Discharge status: Home / Self Care | Payer: Medicaid Other | Attending: Family Medicine | Admitting: Family Medicine

## 2019-07-16 ENCOUNTER — Other Ambulatory Visit: Payer: Self-pay

## 2019-07-16 DIAGNOSIS — S0501XA Injury of conjunctiva and corneal abrasion without foreign body, right eye, initial encounter: Secondary | ICD-10-CM | POA: Diagnosis not present

## 2019-07-16 MED ORDER — POLYMYXIN B-TRIMETHOPRIM 10000-0.1 UNIT/ML-% OP SOLN: 1.0000 [drp] | OPHTHALMIC | 0 refills | Status: DC

## 2019-07-16 NOTE — ED Provider Notes (Signed)
[place]    CSN: [id number] Arrival date & time: 07/16/19  1048      History   Chief Complaint Chief Complaint  Patient presents with  . Eye Drainage    HPI Tamara Waller is a 39 y.o. female.   HPI   child reached up to give her a hug and accidentally poked her in the eye Today it feels like "something is in it" vision normal Mild tearing    Past Medical History:  Diagnosis Date  . Asthma   . Chlamydia   . GERD (gastroesophageal reflux disease)   . Pyelonephritis   . Trichomonas   . UTI (urinary tract infection)     Patient Active Problem List   Diagnosis Date Noted  . UTI symptoms 10/14/2013  . Constipation, chronic 10/14/2013  . Urinary tract infection, site not specified 08/04/2013  . Toenail fungus 02/17/2013  . Other and unspecified ovarian cyst 11/28/2012  . BV (bacterial vaginosis) 11/28/2012  . Dermatitis 11/28/2012  . 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 Vaginitis and vulvovaginitis, unspecified 10/23/2012    Past Surgical History:  Procedure Laterality Date  . CARDIAC SURGERY     as newborn  . HERNIA REPAIR      OB History    Gravida  3   Para  3   Term  2   Preterm  1   AB  0   Living  3     SAB  0   TAB  0   Ectopic  0   Multiple  0   Live Births  3            Home Medications    Prior to Admission medications   Medication Sig Start Date End Date Taking? Authorizing Provider  medroxyPROGESTERone (DEPO-PROVERA) 150 MG/ML injection Inject 1 mL (150 mg total) into the muscle every 3 (three) months. 05/29/18   [name], MD  Multiple Vitamin (MULTIVITAMIN) tablet Take 1 tablet by mouth daily.    [provider]  trimethoprim-polymyxin b (POLYTRIM) ophthalmic solution Place 1 drop into the right eye every 4 (four) hours. 07/16/19   [name], MD  VITAMIN D PO Take by mouth.    [provider]  albuterol (PROVENTIL HFA;VENTOLIN HFA) 108 (90 Base) MCG/ACT inhaler  Inhale 1-2 puffs into the lungs every 4 (four) hours as needed for wheezing or shortness of breath. Patient not taking: Reported on 12/11/2017 07/22/16 07/16/19  [name], MD  fluticasone [facility]) 50 MCG/ACT nasal spray Place 2 sprays into both nostrils daily. Patient not taking: Reported on 05/29/2018 08/08/15 07/16/19  [name], PA-C    Family History Family History  Problem Relation Age of Onset  . Schizophrenia Mother     Social History Social History   Tobacco Use  . Smoking status: Never Smoker  . Smokeless tobacco: Never Used  Substance Use Topics  . Alcohol use: No    Alcohol/week: 0.0 standard drinks  . Drug use: No     Allergies   Patient has no known allergies.   Review of Systems Review of Systems  Eyes: Positive for discharge and redness. Negative for photophobia and visual disturbance.     Physical Exam Triage Vital Signs ED Triage Vitals  Enc Vitals Group     BP 07/16/19 1132 133/84     Pulse Rate 07/16/19 1132 92     Resp 07/16/19 1132 16     Temp 07/16/19 1132 98.1 F (  36.7 C)     Temp src --      SpO2 07/16/19 1132 100 %     Weight 07/16/19 1128 172 lb (78 kg)     Height --      Head Circumference --      Peak Flow --      Pain Score 07/16/19 1128 9     Pain Loc --      Pain Edu? --      Excl. in [place]? --    No data found.  Updated Vital Signs BP 133/84 (BP Location: Right Arm)   Pulse 92   Temp 98.1 F (36.7 C)   Resp 16   Wt 78 kg   LMP 07/14/2019   SpO2 100%   BMI 31.46 kg/m   Visual Acuity Right Eye Distance: 20/70 Left Eye Distance: 20/40 Bilateral Distance: 20/70  Right Eye Near:   Left Eye Near:    Bilateral Near:     Physical Exam Constitutional:      General: She is in acute distress.     Appearance: Normal appearance. She is well-developed.     Comments: Uncomfortable   HENT:     Head: Normocephalic and atraumatic.  Eyes:     General: Lids are normal. Lids are everted, no foreign bodies  appreciated. Vision grossly intact. Gaze aligned appropriately.        Right eye: No foreign body.        Left eye: No foreign body.     Conjunctiva/sclera:     Right eye: Right conjunctiva is injected. No chemosis, exudate or hemorrhage.    Pupils: Pupils are equal, round, and reactive to light.   Cardiovascular:     Rate and Rhythm: Normal rate.  Pulmonary:     Effort: Pulmonary effort is normal. No respiratory distress.  Abdominal:     General: There is no distension.     Palpations: Abdomen is soft.  Musculoskeletal:        General: Normal range of motion.     Cervical back: Normal range of motion.  Skin:    General: Skin is warm and dry.  Neurological:     Mental Status: She is alert.  Psychiatric:        Mood and Affect: Mood normal.        Behavior: Behavior normal.      UC Treatments / Results  Labs (all labs ordered are listed, but only abnormal results are displayed) Labs Reviewed - No data to display  EKG   Radiology No results found.  Procedures Procedures (including critical care time)  Medications Ordered in UC Medications - No data to display  Initial Impression / Assessment and Plan / UC Course  I have reviewed the triage vital signs and the nursing notes.  Pertinent labs & imaging results that were available during my care of the patient were reviewed by me and considered in my medical decision making (see chart for details).      Final Clinical Impressions(s) / UC Diagnoses   Final diagnoses:  Conjunctival abrasion, right, initial encounter     Discharge Instructions     Use cool compresses on eye Use eye drops until discomfort goes away See your eye doctor if not better in a couple of days   ED Prescriptions    Medication Sig Dispense Auth. Provider   trimethoprim-polymyxin b (POLYTRIM) ophthalmic solution Place 1 drop into the right eye every 4 (four) hours. 10 mL [name]  Collie Siad, MD     PDMP not reviewed this encounter.    [name], MD 07/16/19 [phone]

## 2019-07-16 NOTE — Discharge Instructions (Signed)
Use cool compresses on eye Use eye drops until discomfort goes away See your eye doctor if not better in a couple of days

## 2019-07-16 NOTE — ED Triage Notes (Signed)
Pt states she has right pain and draining. Pt states her son gave her a hug and he poked her in the eye by mistake.

## 2019-07-20 ENCOUNTER — Other Ambulatory Visit: Payer: Self-pay

## 2019-07-20 ENCOUNTER — Ambulatory Visit (HOSPITAL_COMMUNITY)
Admission: EM | Admit: 2019-07-20 | Discharge: 2019-07-20 | Discharge status: Absent without leave | Payer: No Typology Code available for payment source

## 2019-08-27 ENCOUNTER — Ambulatory Visit (INDEPENDENT_AMBULATORY_CARE_PROVIDER_SITE_OTHER): Payer: Medicaid Other | Admitting: Obstetrics

## 2019-08-27 ENCOUNTER — Other Ambulatory Visit (HOSPITAL_COMMUNITY)
Admission: RE | Admit: 2019-08-27 | Discharge: 2019-08-27 | Disposition: A | Discharge status: Home / Self Care | Payer: Medicaid Other | Source: Ambulatory Visit | Attending: Obstetrics | Admitting: Obstetrics

## 2019-08-27 ENCOUNTER — Other Ambulatory Visit: Payer: Self-pay

## 2019-08-27 ENCOUNTER — Encounter: Payer: Self-pay | Admitting: Obstetrics

## 2019-08-27 VITALS — BP 134/75 | HR 91 | Ht 62.0 in | Wt 170.0 lb

## 2019-08-27 DIAGNOSIS — Z01419 Encounter for gynecological examination (general) (routine) without abnormal findings: Secondary | ICD-10-CM | POA: Diagnosis present

## 2019-08-27 DIAGNOSIS — Z113 Encounter for screening for infections with a predominantly sexual mode of transmission: Secondary | ICD-10-CM

## 2019-08-27 DIAGNOSIS — Z3202 Encounter for pregnancy test, result negative: Secondary | ICD-10-CM | POA: Diagnosis not present

## 2019-08-27 DIAGNOSIS — N898 Other specified noninflammatory disorders of vagina: Secondary | ICD-10-CM

## 2019-08-27 DIAGNOSIS — Z0000 Encounter for general adult medical examination without abnormal findings: Secondary | ICD-10-CM | POA: Diagnosis not present

## 2019-08-27 DIAGNOSIS — Z30013 Encounter for initial prescription of injectable contraceptive: Secondary | ICD-10-CM

## 2019-08-27 DIAGNOSIS — Z3009 Encounter for other general counseling and advice on contraception: Secondary | ICD-10-CM

## 2019-08-27 DIAGNOSIS — Z1239 Encounter for other screening for malignant neoplasm of breast: Secondary | ICD-10-CM

## 2019-08-27 LAB — POCT URINE PREGNANCY: Preg Test, Ur: NEGATIVE

## 2019-08-27 MED ORDER — MEDROXYPROGESTERONE ACETATE 150 MG/ML IM SUSP: 150.0000 mg | INTRAMUSCULAR | 4 refills | Status: DC

## 2019-08-27 NOTE — Progress Notes (Signed)
Subjective:        Tamara Waller is a 39 y.o. female here for a routine exam.  Current complaints: None.    Personal health questionnaire:  Is patient Tamara Waller, have a family history of breast and/or ovarian cancer: no Is there a family history of uterine cancer diagnosed at age < 45, gastrointestinal cancer, urinary tract cancer, family member who is a Field seismologist syndrome-associated carrier: no Is the patient overweight and hypertensive, family history of diabetes, personal history of gestational diabetes, preeclampsia or PCOS: no Is patient over 62, have PCOS,  family history of premature CHD under age 44, diabetes, smoke, have hypertension or peripheral artery disease:  no At any time, has a partner hit, kicked or otherwise hurt or frightened you?: no Over the past 2 weeks, have you felt down, depressed or hopeless?: no Over the past 2 weeks, have you felt little interest or pleasure in doing things?:no   Gynecologic History Patient's last menstrual period was 08/01/2019. Contraception: none Last Pap: 12-02-2017. Results were: normal Last mammogram: none. Results were: none  Obstetric History OB History  Gravida Para Term Preterm AB Living  3 3 2 1  0 3  SAB TAB Ectopic Multiple Live Births  0 0 0 0 3    # Outcome Date GA Lbr Len/2nd Weight Sex Delivery Anes PTL Lv  3 Preterm  [redacted]w[redacted]d   M Vag-Spont   LIV  2 Term      Vag-Spont   LIV  1 Term      Vag-Spont   LIV    Past Medical History:  Diagnosis Date  . Asthma   . Chlamydia   . GERD (gastroesophageal reflux disease)   . Pyelonephritis   . Trichomonas   . UTI (urinary tract infection)     Past Surgical History:  Procedure Laterality Date  . CARDIAC SURGERY     as newborn  . HERNIA REPAIR       Current Outpatient Medications:  Marland Kitchen  Multiple Vitamin (MULTIVITAMIN) tablet, Take 1 tablet by mouth daily., Disp: , Rfl:  .  VITAMIN D PO, Take by mouth., Disp: , Rfl:  .  zinc gluconate 50 MG tablet, Take by  mouth., Disp: , Rfl:  .  medroxyPROGESTERone (DEPO-PROVERA) 150 MG/ML injection, Inject 1 mL (150 mg total) into the muscle every 3 (three) months., Disp: 1 mL, Rfl: 4 No Known Allergies  Social History   Tobacco Use  . Smoking status: Never Smoker  . Smokeless tobacco: Never Used  Substance Use Topics  . Alcohol use: No    Alcohol/week: 0.0 standard drinks    Family History  Problem Relation Age of Onset  . Schizophrenia Mother       Review of Systems  Constitutional: negative for fatigue and weight loss Respiratory: negative for cough and wheezing Cardiovascular: negative for chest pain, fatigue and palpitations Gastrointestinal: negative for abdominal pain and change in bowel habits Musculoskeletal:negative for myalgias Neurological: negative for gait problems and tremors Behavioral/Psych: negative for abusive relationship, depression Endocrine: negative for temperature intolerance    Genitourinary:negative for abnormal menstrual periods, genital lesions, hot flashes, sexual problems and vaginal discharge Integument/breast: negative for breast lump, breast tenderness, nipple discharge and skin lesion(s)    Objective:       BP 134/75   Pulse 91   Ht 5\' 2"  (1.575 m)   Wt 170 lb (77.1 kg)   LMP 08/01/2019   BMI 31.09 kg/m  General:   alert  Skin:  no rash or abnormalities  Lungs:   clear to auscultation bilaterally  Heart:   regular rate and rhythm, S1, S2 normal, no murmur, click, rub or gallop  Breasts:   normal without suspicious masses, skin or nipple changes or axillary nodes  Abdomen:  normal findings: no organomegaly, soft, non-tender and no hernia  Pelvis:  External genitalia: normal general appearance Urinary system: urethral meatus normal and bladder without fullness, nontender Vaginal: normal without tenderness, induration or masses Cervix: normal appearance Adnexa: normal bimanual exam Uterus: anteverted and non-tender, normal size   Lab  Review Urine pregnancy test Labs reviewed yes Radiologic studies reviewed yes  50% of 25 min visit spent on counseling and coordination of care.   Assessment:    Healthy female exam.    Plan:    Education reviewed: calcium supplements, depression evaluation, low fat, low cholesterol diet, safe sex/STD prevention, self breast exams and weight bearing exercise. Contraception: Depo-Provera injections. Mammogram ordered. Follow up in: 1 year.   Meds ordered this encounter  Medications  . medroxyPROGESTERone (DEPO-PROVERA) 150 MG/ML injection    Sig: Inject 1 mL (150 mg total) into the muscle every 3 (three) months.    Dispense:  1 mL    Refill:  4   Orders Placed This Encounter  Procedures  . Hepatitis B surface antigen  . Hepatitis C antibody  . HIV Antibody (routine testing w rflx)  . RPR  . POCT urine pregnancy    [name], MD 08/27/2019 11:04 AM

## 2019-08-27 NOTE — Progress Notes (Signed)
Patient presents for AEX. Patient desires to have std testing completed. Patient desires to start depo provera. Her last IC was 08/17/19 unprotected.  Last pap: 12/02/2017 Normal MM: Will need order

## 2019-08-28 LAB — CERVICOVAGINAL ANCILLARY ONLY
Bacterial Vaginitis (gardnerella): POSITIVE — AB
Candida Glabrata: NEGATIVE
Candida Vaginitis: POSITIVE — AB
Chlamydia: NEGATIVE
Comment: NEGATIVE
Comment: NEGATIVE
Comment: NEGATIVE
Comment: NEGATIVE
Comment: NEGATIVE
Comment: NORMAL
Neisseria Gonorrhea: NEGATIVE
Trichomonas: NEGATIVE

## 2019-08-28 LAB — HEPATITIS C ANTIBODY: Hep C Virus Ab: 0.2 s/co ratio (ref 0.0–0.9)

## 2019-08-28 LAB — RPR: RPR Ser Ql: NONREACTIVE

## 2019-08-28 LAB — CYTOLOGY - PAP
Comment: NEGATIVE
Diagnosis: NEGATIVE
High risk HPV: NEGATIVE

## 2019-08-28 LAB — HEPATITIS B SURFACE ANTIGEN: Hepatitis B Surface Ag: NEGATIVE

## 2019-08-28 LAB — HIV ANTIBODY (ROUTINE TESTING W REFLEX): HIV Screen 4th Generation wRfx: NONREACTIVE

## 2019-09-04 ENCOUNTER — Ambulatory Visit (INDEPENDENT_AMBULATORY_CARE_PROVIDER_SITE_OTHER): Payer: Medicaid Other

## 2019-09-04 ENCOUNTER — Other Ambulatory Visit: Payer: Self-pay

## 2019-09-04 DIAGNOSIS — Z30013 Encounter for initial prescription of injectable contraceptive: Secondary | ICD-10-CM

## 2019-09-04 MED ORDER — MEDROXYPROGESTERONE ACETATE 150 MG/ML IM SUSP
150.0000 mg | Freq: Once | INTRAMUSCULAR | Status: AC
  Administered 2019-09-04: 150 mg via INTRAMUSCULAR

## 2019-09-04 NOTE — Progress Notes (Signed)
Tamara Waller is here for depo injection.  Pt tolerated injection well with no complication. -EH/RMA

## 2019-09-04 NOTE — Progress Notes (Signed)
Patient seen and assessed by nursing staff during this encounter. I have reviewed the chart and agree with the documentation and plan. I have also made any necessary editorial changes.  Mora Bellman, MD 09/04/2019 11:14 AM

## 2019-09-17 ENCOUNTER — Ambulatory Visit: Payer: Medicaid Other

## 2019-10-21 ENCOUNTER — Other Ambulatory Visit: Payer: Self-pay

## 2019-10-21 ENCOUNTER — Ambulatory Visit: Payer: Medicaid Other

## 2019-10-22 ENCOUNTER — Ambulatory Visit: Payer: Medicaid Other

## 2019-11-05 ENCOUNTER — Other Ambulatory Visit: Payer: Self-pay | Admitting: Obstetrics

## 2019-11-05 DIAGNOSIS — Z1239 Encounter for other screening for malignant neoplasm of breast: Secondary | ICD-10-CM

## 2019-11-12 ENCOUNTER — Other Ambulatory Visit: Payer: Self-pay | Admitting: Obstetrics

## 2019-11-12 DIAGNOSIS — Z30016 Encounter for initial prescription of transdermal patch hormonal contraceptive device: Secondary | ICD-10-CM

## 2019-11-12 MED ORDER — XULANE 150-35 MCG/24HR TD PTWK: 1.0000 | MEDICATED_PATCH | TRANSDERMAL | 12 refills | Status: DC

## 2019-11-18 ENCOUNTER — Telehealth (INDEPENDENT_AMBULATORY_CARE_PROVIDER_SITE_OTHER): Payer: Medicaid Other | Admitting: Obstetrics

## 2019-11-18 ENCOUNTER — Encounter: Payer: Self-pay | Admitting: Obstetrics

## 2019-11-18 DIAGNOSIS — Z3042 Encounter for surveillance of injectable contraceptive: Secondary | ICD-10-CM

## 2019-11-18 DIAGNOSIS — Z3009 Encounter for other general counseling and advice on contraception: Secondary | ICD-10-CM | POA: Diagnosis not present

## 2019-11-18 DIAGNOSIS — Z30016 Encounter for initial prescription of transdermal patch hormonal contraceptive device: Secondary | ICD-10-CM | POA: Diagnosis not present

## 2019-11-18 NOTE — Progress Notes (Signed)
Pt is on the phone preparing for virtual visit with provider. She would like to discuss switching contraceptive methods.

## 2019-11-18 NOTE — Progress Notes (Signed)
    GYNECOLOGY VIRTUAL VISIT ENCOUNTER NOTE  Provider location: [facility] [place]   I connected with Tamara Waller on 11/18/19 at  8:15 AM EDT by MyChart Video Encounter at home and verified that I am speaking with the correct person using two identifiers.   I discussed the limitations, risks, security and privacy concerns of performing an evaluation and management service virtually and the availability of in person appointments. I also discussed with the patient that there may be a patient responsible charge related to this service. The patient expressed understanding and agreed to proceed.   History:  Tamara Waller is a 39 y.o. [phone] female being evaluated today for contraceptive management. She denies any abnormal vaginal discharge, bleeding, pelvic pain or other concerns.       Past Medical History:  Diagnosis Date  . Asthma   . Chlamydia   . GERD (gastroesophageal reflux disease)   . Pyelonephritis   . Trichomonas   . UTI (urinary tract infection)    Past Surgical History:  Procedure Laterality Date  . CARDIAC SURGERY     as newborn  . HERNIA REPAIR     The following portions of the patient's history were reviewed and updated as appropriate: allergies, current medications, past family history, past medical history, past social history, past surgical history and problem list.   Health Maintenance:  Normal pap and negative HRHPV on 08-27-2019.    Review of Systems:  Pertinent items noted in HPI and remainder of comprehensive ROS otherwise negative.  Physical Exam:   General:  Alert, oriented and cooperative. Patient appears to be in no acute distress.  Mental Status: Normal mood and affect. Normal behavior. Normal judgment and thought content.   Respiratory: Normal respiratory effort, no problems with respiration noted  Rest of physical exam deferred due to type of encounter  Labs and Imaging No results found for this or any previous visit  (from the past 336 hour(s)). No results found.     Assessment and Plan:     1. Encounter for surveillance of injectable contraceptive - undesired weight gain with Depo Provera injections  2. Encounter for other general counseling and advice on contraception - wants to switch to the Patch  3. Encounter for initial prescription of transdermal patch hormonal contraceptive device - Xulane Transdermal Patch Rx       I discussed the assessment and treatment plan with the patient. The patient was provided an opportunity to ask questions and all were answered. The patient agreed with the plan and demonstrated an understanding of the instructions.   The patient was advised to call back or seek an in-person evaluation/go to the ED if the symptoms worsen or if the condition fails to improve as anticipated.  I provided 15 minutes of face-to-face time during this encounter.   Baltazar Najjar, MD [facility] 11/18/19

## 2019-11-19 ENCOUNTER — Ambulatory Visit: Payer: Medicaid Other

## 2020-01-03 ENCOUNTER — Encounter (HOSPITAL_COMMUNITY): Payer: Self-pay | Admitting: Emergency Medicine

## 2020-01-03 ENCOUNTER — Ambulatory Visit (HOSPITAL_COMMUNITY)
Admission: EM | Admit: 2020-01-03 | Discharge: 2020-01-03 | Disposition: A | Discharge status: Home / Self Care | Payer: Medicaid Other | Attending: Physician Assistant | Admitting: Physician Assistant

## 2020-01-03 ENCOUNTER — Other Ambulatory Visit: Payer: Self-pay

## 2020-01-03 DIAGNOSIS — Z3202 Encounter for pregnancy test, result negative: Secondary | ICD-10-CM

## 2020-01-03 DIAGNOSIS — R109 Unspecified abdominal pain: Secondary | ICD-10-CM | POA: Diagnosis not present

## 2020-01-03 LAB — BASIC METABOLIC PANEL
Anion gap: 11 (ref 5–15)
BUN: 8 mg/dL (ref 6–20)
CO2: 23 mmol/L (ref 22–32)
Calcium: 9.6 mg/dL (ref 8.9–10.3)
Chloride: 104 mmol/L (ref 98–111)
Creatinine, Ser: 0.86 mg/dL (ref 0.44–1.00)
GFR calc Af Amer: >60 mL/min (ref >60)
GFR calc non Af Amer: >60 mL/min (ref >60)
Glucose, Bld: 78 mg/dL (ref 70–99)
Potassium: 3.9 mmol/L (ref 3.5–5.1)
Sodium: 138 mmol/L (ref 135–145)

## 2020-01-03 LAB — POCT URINALYSIS DIPSTICK, ED / UC
Glucose, UA: NEGATIVE mg/dL
Hgb urine dipstick: NEGATIVE
Leukocytes,Ua: NEGATIVE
Nitrite: NEGATIVE
Protein, ur: NEGATIVE mg/dL
Specific Gravity, Urine: >=1.03 (ref 1.005–1.030)
Urobilinogen, UA: 2 mg/dL — ABNORMAL HIGH (ref 0.0–1.0)
pH: 5.5 (ref 5.0–8.0)

## 2020-01-03 LAB — CBC
HCT: 40.9 % (ref 36.0–46.0)
Hemoglobin: 13.7 g/dL (ref 12.0–15.0)
MCH: 29.2 pg (ref 26.0–34.0)
MCHC: 33.5 g/dL (ref 30.0–36.0)
MCV: 87.2 fL (ref 80.0–100.0)
Platelets: 449 10*3/uL — ABNORMAL HIGH (ref 150–400)
RBC: 4.69 MIL/uL (ref 3.87–5.11)
RDW: 12.3 % (ref 11.5–15.5)
WBC: 8.2 10*3/uL (ref 4.0–10.5)
nRBC: 0 % (ref 0.0–0.2)

## 2020-01-03 LAB — POC URINE PREG, ED: Preg Test, Ur: NEGATIVE

## 2020-01-03 MED ORDER — DICLOFENAC SODIUM 1 % EX GEL: 2.0000 g | Freq: Four times a day (QID) | CUTANEOUS | 0 refills | Status: DC

## 2020-01-03 MED ORDER — NAPROXEN 500 MG PO TABS
500.0000 mg | ORAL_TABLET | Freq: Two times a day (BID) | ORAL | 0 refills | Status: DC
Start: 2020-01-03 — End: 2020-09-21

## 2020-01-03 MED ORDER — LIDOCAINE 4 % EX PTCH: 1.0000 | MEDICATED_PATCH | Freq: Two times a day (BID) | CUTANEOUS | 0 refills | Status: DC

## 2020-01-03 MED ORDER — ACETAMINOPHEN 325 MG PO TABS
650.0000 mg | ORAL_TABLET | Freq: Four times a day (QID) | ORAL | 0 refills | Status: DC | PRN
Start: 2020-01-03 — End: 2022-02-06

## 2020-01-03 MED ORDER — TIZANIDINE HCL 4 MG PO TABS: 4.0000 mg | ORAL_TABLET | Freq: Every day | ORAL | 0 refills | Status: AC

## 2020-01-03 NOTE — ED Triage Notes (Signed)
Pt c/o of left sided flank pain starting yesterday. Sts pain comes and goes in waved. Denies fevers, dysuria or injury. Took 200mg  Advil 9:00am.

## 2020-01-03 NOTE — Discharge Instructions (Signed)
I believe this is in your muscles, however we spent some basic labs and we will culture your urine.  If we need to discuss treatments I will call you otherwise if these labs are normal I will be in your MyChart  If symptoms or not improving with the treatments, call your primary care tomorrow to schedule follow-up  Take the medications as prescribed -Take naproxen with food and plenty water -Take the muscle relaxer tizanidine/Zanaflex at night, this will make you sleepy do not drive, drink alcohol or operate machinery within 8 hours -You may apply the Voltaren gel to the areas of pain as prescribed -By the lidocaine patches as needed -You may take 2 regular strength Tylenol for additional pain relief  If you develop severe worsening pain, shortness of breath, vomiting go to the emergency department.

## 2020-01-03 NOTE — ED Provider Notes (Signed)
[place]    CSN: [id number] Arrival date & time: 01/03/20  1349      History   Chief Complaint Chief Complaint  Patient presents with  . Flank Pain    HPI Tamara Waller is a 39 y.o. female.   Patient presents for left flank pain.  She reports this started yesterday morning.  She reports sharp pains coming in waves.  She reports if she is still and in certain position she does not feel the pain.  She reports pangs of pain when moving in certain directions such as bending or twisting.  She also reports it hurts to touch that side.  She is concerned whether this could be related to her kidneys as she had a kidney infection when she was younger.  Denies chest pain or shortness of breath.  Deep breath does not cause the pain.  She denies painful urination, frequency, urgency or blood in the urine.  Denies nausea, vomiting or diarrhea.  Denies fever or chills.  She does note that she was wrestling with her son in the days prior.  She has not tried any medications for this.     Past Medical History:  Diagnosis Date  . Asthma   . Chlamydia   . GERD (gastroesophageal reflux disease)   . Pyelonephritis   . Trichomonas   . UTI (urinary tract infection)     Patient Active Problem List   Diagnosis Date Noted  . UTI symptoms 10/14/2013  . Constipation, chronic 10/14/2013  . Urinary tract infection, site not specified 08/04/2013  . Toenail fungus 02/17/2013  . Other and unspecified ovarian cyst 11/28/2012  . BV (bacterial vaginosis) 11/28/2012  . Dermatitis 11/28/2012  . 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 Vaginitis and vulvovaginitis, unspecified 10/23/2012    Past Surgical History:  Procedure Laterality Date  . CARDIAC SURGERY     as newborn  . HERNIA REPAIR      OB History    Gravida  3   Para  3   Term  2   Preterm  1   AB  0   Living  3     SAB  0   TAB  0   Ectopic  0   Multiple  0   Live Births  3            Home  Medications    Prior to Admission medications   Medication Sig Start Date End Date Taking? Authorizing Provider  Multiple Vitamin (MULTIVITAMIN) tablet Take 1 tablet by mouth daily.   Yes [provider]  norelgestromin-ethinyl estradiol Marilu Favre) 150-35 MCG/24HR transdermal patch Place 1 patch onto the skin once a week. 11/12/19  Yes [name], MD  VITAMIN D PO Take by mouth.   Yes [provider]  zinc gluconate 50 MG tablet Take by mouth.   Yes [provider]  acetaminophen (TYLENOL) 325 MG tablet Take 2 tablets (650 mg total) by mouth every 6 (six) hours as needed. 01/03/20   Jmarion Christiano, Marguerita Beards, PA-C  diclofenac Sodium (VOLTAREN) 1 % GEL Apply 2 g topically 4 (four) times daily. 01/03/20   Desaray Marschner, Marguerita Beards, PA-C  Lidocaine (HM LIDOCAINE PATCH) 4 % PTCH Apply 1 patch topically in the morning and at bedtime. 01/03/20   Algernon Mundie, Marguerita Beards, PA-C  naproxen (NAPROSYN) 500 MG tablet Take 1 tablet (500 mg total) by mouth 2 (two) times daily. 01/03/20   Treshawn Allen, Marguerita Beards, PA-C  tiZANidine (ZANAFLEX) 4  MG tablet Take 1 tablet (4 mg total) by mouth at bedtime for 10 days. 01/03/20 01/13/20  Izreal Kock, Marguerita Beards, PA-C  albuterol (PROVENTIL HFA;VENTOLIN HFA) 108 (90 Base) MCG/ACT inhaler Inhale 1-2 puffs into the lungs every 4 (four) hours as needed for wheezing or shortness of breath. Patient not taking: Reported on 12/11/2017 07/22/16 07/16/19  [name], MD  fluticasone [facility]) 50 MCG/ACT nasal spray Place 2 sprays into both nostrils daily. Patient not taking: Reported on 05/29/2018 08/08/15 07/16/19  [name], PA-C    Family History Family History  Problem Relation Age of Onset  . Schizophrenia Mother     Social History Social History   Tobacco Use  . Smoking status: Never Smoker  . Smokeless tobacco: Never Used  Vaping Use  . Vaping Use: Never used  Substance Use Topics  . Alcohol use: No    Alcohol/week: 0.0 standard drinks  . Drug use: No     Allergies   Patient has  no known allergies.   Review of Systems Review of Systems   Physical Exam Triage Vital Signs ED Triage Vitals  Enc Vitals Group     BP 01/03/20 1421 (!) 110/97     Pulse Rate 01/03/20 1418 92     Resp 01/03/20 1418 16     Temp 01/03/20 1421 98.8 F (37.1 C)     Temp Source 01/03/20 1421 Oral     SpO2 01/03/20 1421 99 %     Weight --      Height --      Head Circumference --      Peak Flow --      Pain Score 01/03/20 1416 10     Pain Loc --      Pain Edu? --      Excl. in [place]? --    No data found.  Updated Vital Signs BP (!) 110/97 (BP Location: Right Arm)   Pulse 92   Temp 98.8 F (37.1 C) (Oral)   Resp 16   LMP 12/24/2019 (Within Days)   SpO2 99%   Visual Acuity Right Eye Distance:   Left Eye Distance:   Bilateral Distance:    Right Eye Near:   Left Eye Near:    Bilateral Near:     Physical Exam Vitals and nursing note reviewed.  Constitutional:      General: She is not in acute distress.    Appearance: She is well-developed. She is not ill-appearing.  HENT:     Head: Normocephalic and atraumatic.  Eyes:     Conjunctiva/sclera: Conjunctivae normal.  Cardiovascular:     Rate and Rhythm: Normal rate and regular rhythm.     Heart sounds: No murmur heard.   Pulmonary:     Effort: Pulmonary effort is normal. No respiratory distress.     Breath sounds: Normal breath sounds.     Comments: Equal bilateral rise and fall the chest. Abdominal:     Palpations: Abdomen is soft.     Tenderness: There is no abdominal tenderness. There is no right CVA tenderness or left CVA tenderness.  Musculoskeletal:     Cervical back: Neck supple.     Comments: Tenderness to palpation over the left lower ribs in the axillary line as well as in the musculature of the lateral left back.  There is no overlying rash or erythema.  Pain is reproducible to the point patient states this is the exact pain on experiencing.  Pain with twisting as well as  raising arm over the head.  There  is no crepitus in the ribs.  Skin:    General: Skin is warm and dry.  Neurological:     General: No focal deficit present.     Mental Status: She is alert and oriented to person, place, and time.      UC Treatments / Results  Labs (all labs ordered are listed, but only abnormal results are displayed) Labs Reviewed  CBC - Abnormal; Notable for the following components:      Result Value   Platelets 449 (*)    All other components within normal limits  POCT URINALYSIS DIPSTICK, ED / UC - Abnormal; Notable for the following components:   Bilirubin Urine SMALL (*)    Ketones, ur TRACE (*)    Urobilinogen, UA 2.0 (*)    All other components within normal limits  BASIC METABOLIC PANEL  POC URINE PREG, ED    EKG   Radiology No results found.  Procedures Procedures (including critical care time)  Medications Ordered in UC Medications - No data to display  Initial Impression / Assessment and Plan / UC Course  I have reviewed the triage vital signs and the nursing notes.  Pertinent labs & imaging results that were available during my care of the patient were reviewed by me and considered in my medical decision making (see chart for details).     Tag left flank pain Patient is a 40 year old presenting with left flank pain, most likely musculoskeletal.  UA completely normal.  Will send culture.  Doubt infection.  No blood in urine, doubt stone, as there is also minimal urinary symptoms and very reproducible on exam.  Labs all reassuring.  Will treat as musculoskeletal pain.  Discussed return and follow-up precautions.  Instructed patient follow-up with her primary care.  She verbalized understand plan of care. Final Clinical Impressions(s) / UC Diagnoses   Final diagnoses:  Left flank pain     Discharge Instructions     I believe this is in your muscles, however we spent some basic labs and we will culture your urine.  If we need to discuss treatments I will call you  otherwise if these labs are normal I will be in your MyChart  If symptoms or not improving with the treatments, call your primary care tomorrow to schedule follow-up  Take the medications as prescribed -Take naproxen with food and plenty water -Take the muscle relaxer tizanidine/Zanaflex at night, this will make you sleepy do not drive, drink alcohol or operate machinery within 8 hours -You may apply the Voltaren gel to the areas of pain as prescribed -By the lidocaine patches as needed -You may take 2 regular strength Tylenol for additional pain relief  If you develop severe worsening pain, shortness of breath, vomiting go to the emergency department.      ED Prescriptions    Medication Sig Dispense Auth. Provider   naproxen (NAPROSYN) 500 MG tablet Take 1 tablet (500 mg total) by mouth 2 (two) times daily. 30 tablet Zania Kalisz, Marguerita Beards, PA-C   tiZANidine (ZANAFLEX) 4 MG tablet Take 1 tablet (4 mg total) by mouth at bedtime for 10 days. 10 tablet Daira Hine, Marguerita Beards, PA-C   Lidocaine (HM LIDOCAINE PATCH) 4 % PTCH Apply 1 patch topically in the morning and at bedtime. 6 patch Trayton Szabo, Marguerita Beards, PA-C   acetaminophen (TYLENOL) 325 MG tablet Take 2 tablets (650 mg total) by mouth every 6 (six) hours as needed. 30 tablet Aasia Peavler,  Marguerita Beards, PA-C   diclofenac Sodium (VOLTAREN) 1 % GEL Apply 2 g topically 4 (four) times daily. 100 g Donata Reddick, Marguerita Beards, PA-C     PDMP not reviewed this encounter.   Purnell Shoemaker, PA-C 01/03/20 1806

## 2020-01-29 ENCOUNTER — Ambulatory Visit: Payer: Self-pay

## 2020-03-10 ENCOUNTER — Encounter (HOSPITAL_COMMUNITY): Payer: Self-pay

## 2020-03-10 ENCOUNTER — Other Ambulatory Visit: Payer: Self-pay

## 2020-03-10 ENCOUNTER — Ambulatory Visit (HOSPITAL_COMMUNITY)
Admission: EM | Admit: 2020-03-10 | Discharge: 2020-03-10 | Disposition: A | Discharge status: Home / Self Care | Payer: Medicaid Other | Attending: Family Medicine | Admitting: Family Medicine

## 2020-03-10 DIAGNOSIS — Z1152 Encounter for screening for COVID-19: Secondary | ICD-10-CM | POA: Insufficient documentation

## 2020-03-10 DIAGNOSIS — J029 Acute pharyngitis, unspecified: Secondary | ICD-10-CM | POA: Diagnosis not present

## 2020-03-10 LAB — SARS CORONAVIRUS 2 (TAT 6-24 HRS): SARS Coronavirus 2: NEGATIVE

## 2020-03-10 LAB — POCT RAPID STREP A, ED / UC: Streptococcus, Group A Screen (Direct): NEGATIVE

## 2020-03-10 NOTE — ED Triage Notes (Signed)
Pt present sore throat and nasal drainage. Symptoms started two days ago. Pt states it hurts to swallow and it feels like something is stuck in her throat.

## 2020-03-10 NOTE — ED Provider Notes (Signed)
[place]    CSN: [id number] Arrival date & time: 03/10/20  1729      History   Chief Complaint Chief Complaint  Patient presents with  . Sore Throat  . Nasal Congestion    HPI Tamara Waller is a 39 y.o. female.   She is presenting with a 2-day history of sore throat.  She has been around her client with similar symptoms and was diagnosed with strep throat.  No improvement with ibuprofen.  Denies any fevers.  Has had negative Covid test.  No in her house with similar symptoms.  HPI  Past Medical History:  Diagnosis Date  . Asthma   . Chlamydia   . GERD (gastroesophageal reflux disease)   . Pyelonephritis   . Trichomonas   . UTI (urinary tract infection)     Patient Active Problem List   Diagnosis Date Noted  . UTI symptoms 10/14/2013  . Constipation, chronic 10/14/2013  . Urinary tract infection, site not specified 08/04/2013  . Toenail fungus 02/17/2013  . Other and unspecified ovarian cyst 11/28/2012  . BV (bacterial vaginosis) 11/28/2012  . Dermatitis 11/28/2012  . 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 Vaginitis and vulvovaginitis, unspecified 10/23/2012    Past Surgical History:  Procedure Laterality Date  . CARDIAC SURGERY     as newborn  . HERNIA REPAIR      OB History    Gravida  3   Para  3   Term  2   Preterm  1   AB  0   Living  3     SAB  0   TAB  0   Ectopic  0   Multiple  0   Live Births  3            Home Medications    Prior to Admission medications   Medication Sig Start Date End Date Taking? Authorizing Provider  acetaminophen (TYLENOL) 325 MG tablet Take 2 tablets (650 mg total) by mouth every 6 (six) hours as needed. 01/03/20   Darr, Marguerita Beards, PA-C  diclofenac Sodium (VOLTAREN) 1 % GEL Apply 2 g topically 4 (four) times daily. 01/03/20   Darr, Marguerita Beards, PA-C  Lidocaine (HM LIDOCAINE PATCH) 4 % PTCH Apply 1 patch topically in the morning and at bedtime. 01/03/20   Darr, Marguerita Beards, PA-C  Multiple  Vitamin (MULTIVITAMIN) tablet Take 1 tablet by mouth daily.    [provider]  naproxen (NAPROSYN) 500 MG tablet Take 1 tablet (500 mg total) by mouth 2 (two) times daily. 01/03/20   Darr, Marguerita Beards, PA-C  norelgestromin-ethinyl estradiol Marilu Favre) 150-35 MCG/24HR transdermal patch Place 1 patch onto the skin once a week. 11/12/19   [name], MD  VITAMIN D PO Take by mouth.    [provider]  zinc gluconate 50 MG tablet Take by mouth.    [provider]  albuterol (PROVENTIL HFA;VENTOLIN HFA) 108 (90 Base) MCG/ACT inhaler Inhale 1-2 puffs into the lungs every 4 (four) hours as needed for wheezing or shortness of breath. Patient not taking: Reported on 12/11/2017 07/22/16 07/16/19  [name], MD  fluticasone [facility]) 50 MCG/ACT nasal spray Place 2 sprays into both nostrils daily. Patient not taking: Reported on 05/29/2018 08/08/15 07/16/19  [name], PA-C    Family History Family History  Problem Relation Age of Onset  . Schizophrenia Mother     Social History Social History   Tobacco Use  . Smoking status: Never  Smoker  . Smokeless tobacco: Never Used  Vaping Use  . Vaping Use: Never used  Substance Use Topics  . Alcohol use: No    Alcohol/week: 0.0 standard drinks  . Drug use: No     Allergies   Patient has no known allergies.   Review of Systems Review of Systems  See HPI  Physical Exam Triage Vital Signs ED Triage Vitals  Enc Vitals Group     BP 03/10/20 1800 118/89     Pulse Rate 03/10/20 1800 93     Resp 03/10/20 1800 18     Temp 03/10/20 1800 98 F (36.7 C)     Temp Source 03/10/20 1800 Oral     SpO2 03/10/20 1800 99 %     Weight --      Height --      Head Circumference --      Peak Flow --      Pain Score 03/10/20 1801 10     Pain Loc --      Pain Edu? --      Excl. in [place]? --    No data found.  Updated Vital Signs BP 118/89 (BP Location: Right Arm)   Pulse 93   Temp 98 F (36.7 C) (Oral)   Resp 18    SpO2 99%   Visual Acuity Right Eye Distance:   Left Eye Distance:   Bilateral Distance:    Right Eye Near:   Left Eye Near:    Bilateral Near:     Physical Exam Gen: NAD, alert, cooperative with exam, well-appearing ENT: normal lips, normal nasal mucosa, tympanic membranes clear and intact bilaterally, no tonsillar exudates, Eye: normal EOM, normal conjunctiva and lids  Skin: no rashes, no areas of induration  Neuro: normal tone, normal sensation to touch Psych:  normal insight, alert and oriented   UC Treatments / Results  Labs (all labs ordered are listed, but only abnormal results are displayed) Labs Reviewed  SARS CORONAVIRUS 2 (TAT 6-24 HRS)  CULTURE, GROUP A STREP [facility])  POCT RAPID STREP A, ED / UC    EKG   Radiology No results found.  Procedures Procedures (including critical care time)  Medications Ordered in UC Medications - No data to display  Initial Impression / Assessment and Plan / UC Course  I have reviewed the triage vital signs and the nursing notes.  Pertinent labs & imaging results that were available during my care of the patient were reviewed by me and considered in my medical decision making (see chart for details).     Tamara Waller is a 39 year old female that is presenting with sore throat.  Rapid strep was negative and Covid swab was obtained.  Counseled supportive care.  Given indications on follow-up.  Final Clinical Impressions(s) / UC Diagnoses   Final diagnoses:  Encounter for screening for COVID-19  Sore throat     Discharge Instructions     Please try things such as zyrtec-D or allegra-D which is an antihistamine and decongestant.  Please try afrin which will help with nasal congestion but use for only three days.  Please also try using a netti pot on a regular occasion. Please try honey, vick's vapor rub, lozenges and humidifer for cough and sore throat      ED Prescriptions    None     PDMP not reviewed this  encounter.   Rosemarie Ax, MD 03/10/20 [phone]

## 2020-03-10 NOTE — Discharge Instructions (Signed)
Please try things such as zyrtec-D or allegra-D which is an antihistamine and decongestant.  Please try afrin which will help with nasal congestion but use for only three days.  Please also try using a netti pot on a regular occasion. Please try honey, vick's vapor rub, lozenges and humidifer for cough and sore throat

## 2020-03-13 LAB — CULTURE, GROUP A STREP (THRC)

## 2020-09-21 ENCOUNTER — Encounter (HOSPITAL_COMMUNITY): Payer: Self-pay

## 2020-09-21 ENCOUNTER — Other Ambulatory Visit: Payer: Self-pay

## 2020-09-21 ENCOUNTER — Ambulatory Visit (HOSPITAL_COMMUNITY)
Admission: EM | Admit: 2020-09-21 | Discharge: 2020-09-21 | Disposition: A | Discharge status: Home / Self Care | Payer: Medicaid Other | Attending: Family Medicine | Admitting: Family Medicine

## 2020-09-21 DIAGNOSIS — S161XXA Strain of muscle, fascia and tendon at neck level, initial encounter: Secondary | ICD-10-CM

## 2020-09-21 DIAGNOSIS — S39012A Strain of muscle, fascia and tendon of lower back, initial encounter: Secondary | ICD-10-CM

## 2020-09-21 MED ORDER — IBUPROFEN 800 MG PO TABS
800.0000 mg | ORAL_TABLET | Freq: Three times a day (TID) | ORAL | 0 refills | Status: DC
Start: 2020-09-21 — End: 2022-02-06

## 2020-09-21 MED ORDER — CYCLOBENZAPRINE HCL 10 MG PO TABS
ORAL_TABLET | ORAL | 0 refills | Status: DC
Start: 2020-09-21 — End: 2024-02-17
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
## 2022-02-06 ENCOUNTER — Encounter (HOSPITAL_COMMUNITY): Payer: Self-pay | Admitting: Obstetrics and Gynecology

## 2022-02-06 ENCOUNTER — Ambulatory Visit (INDEPENDENT_AMBULATORY_CARE_PROVIDER_SITE_OTHER): Payer: Medicaid Other | Admitting: Family Medicine

## 2022-02-06 ENCOUNTER — Other Ambulatory Visit: Payer: Self-pay

## 2022-02-06 ENCOUNTER — Encounter: Payer: Self-pay | Admitting: Family Medicine

## 2022-02-06 ENCOUNTER — Encounter (HOSPITAL_COMMUNITY)
Admission: AD | Disposition: A | Discharge status: Home / Self Care | Payer: Self-pay | Source: Home / Self Care | Attending: Obstetrics and Gynecology

## 2022-02-06 ENCOUNTER — Inpatient Hospital Stay (HOSPITAL_COMMUNITY): Payer: Medicaid Other | Admitting: Certified Registered Nurse Anesthetist

## 2022-02-06 ENCOUNTER — Inpatient Hospital Stay (EMERGENCY_DEPARTMENT_HOSPITAL): Payer: Medicaid Other | Admitting: Certified Registered Nurse Anesthetist

## 2022-02-06 ENCOUNTER — Inpatient Hospital Stay (HOSPITAL_COMMUNITY): Payer: Medicaid Other

## 2022-02-06 ENCOUNTER — Inpatient Hospital Stay (HOSPITAL_COMMUNITY)
Admission: AD | Admit: 2022-02-06 | Discharge: 2022-02-06 | Disposition: A | Discharge status: Home / Self Care | Payer: Medicaid Other | Attending: Obstetrics and Gynecology | Admitting: Obstetrics and Gynecology

## 2022-02-06 VITALS — BP 123/78 | HR 93 | Ht 62.5 in | Wt 179.0 lb

## 2022-02-06 DIAGNOSIS — O3680X0 Pregnancy with inconclusive fetal viability, not applicable or unspecified: Secondary | ICD-10-CM

## 2022-02-06 DIAGNOSIS — Z3A08 8 weeks gestation of pregnancy: Secondary | ICD-10-CM | POA: Diagnosis not present

## 2022-02-06 DIAGNOSIS — N912 Amenorrhea, unspecified: Secondary | ICD-10-CM

## 2022-02-06 DIAGNOSIS — O00102 Left tubal pregnancy without intrauterine pregnancy: Secondary | ICD-10-CM | POA: Diagnosis not present

## 2022-02-06 DIAGNOSIS — N836 Hematosalpinx: Secondary | ICD-10-CM | POA: Insufficient documentation

## 2022-02-06 DIAGNOSIS — O008 Other ectopic pregnancy without intrauterine pregnancy: Secondary | ICD-10-CM | POA: Diagnosis not present

## 2022-02-06 DIAGNOSIS — J45909 Unspecified asthma, uncomplicated: Secondary | ICD-10-CM | POA: Diagnosis not present

## 2022-02-06 DIAGNOSIS — K219 Gastro-esophageal reflux disease without esophagitis: Secondary | ICD-10-CM | POA: Insufficient documentation

## 2022-02-06 HISTORY — DX: Unspecified ovarian cyst, unspecified side: N83.209

## 2022-02-06 HISTORY — PX: LAPAROSCOPIC UNILATERAL SALPINGECTOMY: SHX5934

## 2022-02-06 LAB — URINALYSIS, ROUTINE W REFLEX MICROSCOPIC
Bilirubin Urine: NEGATIVE
Glucose, UA: NEGATIVE mg/dL
Ketones, ur: NEGATIVE mg/dL
Leukocytes,Ua: NEGATIVE
Nitrite: NEGATIVE
Protein, ur: NEGATIVE mg/dL
Specific Gravity, Urine: 1.013 (ref 1.005–1.030)
pH: 6 (ref 5.0–8.0)

## 2022-02-06 LAB — COMPREHENSIVE METABOLIC PANEL
ALT: 21 U/L (ref 0–44)
AST: 25 U/L (ref 15–41)
Albumin: 3.6 g/dL (ref 3.5–5.0)
Alkaline Phosphatase: 59 U/L (ref 38–126)
Anion gap: 10 (ref 5–15)
BUN: 5 mg/dL — ABNORMAL LOW (ref 6–20)
CO2: 25 mmol/L (ref 22–32)
Calcium: 9.2 mg/dL (ref 8.9–10.3)
Chloride: 102 mmol/L (ref 98–111)
Creatinine, Ser: 0.77 mg/dL (ref 0.44–1.00)
GFR, Estimated: >60 mL/min (ref >60)
Glucose, Bld: 117 mg/dL — ABNORMAL HIGH (ref 70–99)
Potassium: 3.7 mmol/L (ref 3.5–5.1)
Sodium: 137 mmol/L (ref 135–145)
Total Bilirubin: 0.4 mg/dL (ref 0.3–1.2)
Total Protein: 7.2 g/dL (ref 6.5–8.1)

## 2022-02-06 LAB — CBC
HCT: 38.7 % (ref 36.0–46.0)
Hemoglobin: 13.3 g/dL (ref 12.0–15.0)
MCH: 30.1 pg (ref 26.0–34.0)
MCHC: 34.4 g/dL (ref 30.0–36.0)
MCV: 87.6 fL (ref 80.0–100.0)
Platelets: 394 10*3/uL (ref 150–400)
RBC: 4.42 MIL/uL (ref 3.87–5.11)
RDW: 12.5 % (ref 11.5–15.5)
WBC: 4.3 10*3/uL (ref 4.0–10.5)
nRBC: 0 % (ref 0.0–0.2)

## 2022-02-06 LAB — TYPE AND SCREEN
ABO/RH(D): AB POS
Antibody Screen: NEGATIVE

## 2022-02-06 LAB — WET PREP, GENITAL
Sperm: NONE SEEN
Trich, Wet Prep: NONE SEEN
WBC, Wet Prep HPF POC: <10 (ref <10)
Yeast Wet Prep HPF POC: NONE SEEN

## 2022-02-06 LAB — POCT PREGNANCY, URINE: Preg Test, Ur: POSITIVE — AB

## 2022-02-06 LAB — POCT URINE PREGNANCY: Preg Test, Ur: POSITIVE — AB

## 2022-02-06 LAB — HCG, QUANTITATIVE, PREGNANCY: hCG, Beta Chain, Quant, S: 4969 m[IU]/mL — ABNORMAL HIGH (ref <5)

## 2022-02-06 SURGERY — SALPINGECTOMY, UNILATERAL, LAPAROSCOPIC
Anesthesia: General | Laterality: Left

## 2022-02-06 MED ORDER — PROPOFOL 10 MG/ML IV BOLUS
INTRAVENOUS | Status: AC
  Filled 2022-02-06: qty 20

## 2022-02-06 MED ORDER — ORAL CARE MOUTH RINSE: 15.0000 mL | Freq: Once | OROMUCOSAL | Status: AC

## 2022-02-06 MED ORDER — OXYCODONE-ACETAMINOPHEN 5-325 MG PO TABS: 1.0000 | ORAL_TABLET | Freq: Four times a day (QID) | ORAL | 0 refills | Status: DC | PRN

## 2022-02-06 MED ORDER — KETOROLAC TROMETHAMINE 30 MG/ML IJ SOLN: 30.0000 mg | Freq: Once | INTRAMUSCULAR | Status: DC

## 2022-02-06 MED ORDER — FENTANYL CITRATE (PF) 250 MCG/5ML IJ SOLN
INTRAMUSCULAR | Status: AC
  Filled 2022-02-06: qty 5

## 2022-02-06 MED ORDER — PROPOFOL 10 MG/ML IV BOLUS
INTRAVENOUS | Status: DC | PRN
  Administered 2022-02-06: 200 mg via INTRAVENOUS

## 2022-02-06 MED ORDER — SCOPOLAMINE 1 MG/3DAYS TD PT72
MEDICATED_PATCH | TRANSDERMAL | Status: AC
  Filled 2022-02-06: qty 1

## 2022-02-06 MED ORDER — BUPIVACAINE HCL (PF) 0.25 % IJ SOLN
INTRAMUSCULAR | Status: DC | PRN
  Administered 2022-02-06: 30 mL

## 2022-02-06 MED ORDER — LIDOCAINE 2% (20 MG/ML) 5 ML SYRINGE
INTRAMUSCULAR | Status: DC | PRN
  Administered 2022-02-06: 60 mg via INTRAVENOUS

## 2022-02-06 MED ORDER — DEXAMETHASONE SODIUM PHOSPHATE 10 MG/ML IJ SOLN
INTRAMUSCULAR | Status: DC | PRN
  Administered 2022-02-06: 5 mg via INTRAVENOUS

## 2022-02-06 MED ORDER — BUPIVACAINE HCL (PF) 0.25 % IJ SOLN
INTRAMUSCULAR | Status: AC
  Filled 2022-02-06: qty 30

## 2022-02-06 MED ORDER — SODIUM CHLORIDE 0.9 % IR SOLN
Status: DC | PRN
  Administered 2022-02-06: 3000 mL

## 2022-02-06 MED ORDER — FENTANYL CITRATE (PF) 250 MCG/5ML IJ SOLN
INTRAMUSCULAR | Status: DC | PRN
  Administered 2022-02-06: 150 ug via INTRAVENOUS

## 2022-02-06 MED ORDER — IBUPROFEN 600 MG PO TABS: 600.0000 mg | ORAL_TABLET | Freq: Four times a day (QID) | ORAL | 3 refills | Status: DC | PRN

## 2022-02-06 MED ORDER — MIDAZOLAM HCL 2 MG/2ML IJ SOLN
INTRAMUSCULAR | Status: AC
  Filled 2022-02-06: qty 2

## 2022-02-06 MED ORDER — ONDANSETRON HCL 4 MG/2ML IJ SOLN
INTRAMUSCULAR | Status: DC | PRN
  Administered 2022-02-06: 4 mg via INTRAVENOUS

## 2022-02-06 MED ORDER — SUGAMMADEX SODIUM 200 MG/2ML IV SOLN
INTRAVENOUS | Status: DC | PRN
  Administered 2022-02-06: 200 mg via INTRAVENOUS

## 2022-02-06 MED ORDER — CHLORHEXIDINE GLUCONATE 0.12 % MT SOLN
15.0000 mL | Freq: Once | OROMUCOSAL | Status: AC
  Administered 2022-02-06: 15 mL via OROMUCOSAL

## 2022-02-06 MED ORDER — FENTANYL CITRATE (PF) 100 MCG/2ML IJ SOLN
INTRAMUSCULAR | Status: AC
  Filled 2022-02-06: qty 2

## 2022-02-06 MED ORDER — AMISULPRIDE (ANTIEMETIC) 5 MG/2ML IV SOLN
INTRAVENOUS | Status: AC
  Filled 2022-02-06: qty 4

## 2022-02-06 MED ORDER — SUCCINYLCHOLINE CHLORIDE 200 MG/10ML IV SOSY
PREFILLED_SYRINGE | INTRAVENOUS | Status: DC | PRN
  Administered 2022-02-06: 80 mg via INTRAVENOUS

## 2022-02-06 MED ORDER — SCOPOLAMINE 1 MG/3DAYS TD PT72
MEDICATED_PATCH | TRANSDERMAL | Status: DC | PRN
  Administered 2022-02-06: 1 via TRANSDERMAL

## 2022-02-06 MED ORDER — POVIDONE-IODINE 10 % EX SWAB: 2.0000 | Freq: Once | CUTANEOUS | Status: DC

## 2022-02-06 MED ORDER — DEXMEDETOMIDINE (PRECEDEX) IN NS 20 MCG/5ML (4 MCG/ML) IV SYRINGE
PREFILLED_SYRINGE | INTRAVENOUS | Status: DC | PRN
  Administered 2022-02-06: 8 ug via INTRAVENOUS

## 2022-02-06 MED ORDER — LACTATED RINGERS IV BOLUS
500.0000 mL | Freq: Once | INTRAVENOUS | Status: AC
  Administered 2022-02-06: 500 mL via INTRAVENOUS

## 2022-02-06 MED ORDER — LACTATED RINGERS IV SOLN: INTRAVENOUS | Status: DC

## 2022-02-06 MED ORDER — FENTANYL CITRATE (PF) 100 MCG/2ML IJ SOLN
25.0000 ug | INTRAMUSCULAR | Status: DC | PRN
  Administered 2022-02-06 (×2): 50 ug via INTRAVENOUS

## 2022-02-06 MED ORDER — AMISULPRIDE (ANTIEMETIC) 5 MG/2ML IV SOLN
10.0000 mg | Freq: Once | INTRAVENOUS | Status: AC | PRN
  Administered 2022-02-06: 10 mg via INTRAVENOUS

## 2022-02-06 MED ORDER — ROCURONIUM BROMIDE 10 MG/ML (PF) SYRINGE
PREFILLED_SYRINGE | INTRAVENOUS | Status: DC | PRN
  Administered 2022-02-06: 50 mg via INTRAVENOUS
  Administered 2022-02-06: 10 mg via INTRAVENOUS

## 2022-02-06 MED ORDER — OXYCODONE HCL 5 MG/5ML PO SOLN: 5.0000 mg | Freq: Once | ORAL | Status: DC | PRN

## 2022-02-06 MED ORDER — PROMETHAZINE HCL 25 MG/ML IJ SOLN: 6.2500 mg | INTRAMUSCULAR | Status: DC | PRN

## 2022-02-06 MED ORDER — MIDAZOLAM HCL 5 MG/5ML IJ SOLN
INTRAMUSCULAR | Status: DC | PRN
  Administered 2022-02-06: 2 mg via INTRAVENOUS

## 2022-02-06 MED ORDER — OXYCODONE HCL 5 MG PO TABS: 5.0000 mg | ORAL_TABLET | Freq: Once | ORAL | Status: DC | PRN

## 2022-02-06 MED ORDER — ACETAMINOPHEN 10 MG/ML IV SOLN: 1000.0000 mg | Freq: Once | INTRAVENOUS | Status: DC | PRN

## 2022-02-06 SURGICAL SUPPLY — 33 items
ADH SKN CLS APL DERMABOND .7 (GAUZE/BANDAGES/DRESSINGS) ×2
APL SRG 38 LTWT LNG FL B (MISCELLANEOUS) ×2
APPLICATOR ARISTA FLEXITIP XL (MISCELLANEOUS) ×1 IMPLANT
BAG SPEC RTRVL LRG 6X4 10 (ENDOMECHANICALS) ×2
DERMABOND ADVANCED .7 DNX12 (GAUZE/BANDAGES/DRESSINGS) ×2 IMPLANT
DRSG OPSITE POSTOP 3X4 (GAUZE/BANDAGES/DRESSINGS) ×1 IMPLANT
DURAPREP 26ML APPLICATOR (WOUND CARE) ×2 IMPLANT
ELECT REM PT RETURN 9FT ADLT (ELECTROSURGICAL)
ELECTRODE REM PT RTRN 9FT ADLT (ELECTROSURGICAL) IMPLANT
GLOVE BIOGEL PI IND STRL 6.5 (GLOVE) ×4 IMPLANT
GLOVE BIOGEL PI IND STRL 7.0 (GLOVE) ×4 IMPLANT
GLOVE SURG SS PI 6.5 STRL IVOR (GLOVE) ×2 IMPLANT
GOWN STRL REUS W/ TWL LRG LVL3 (GOWN DISPOSABLE) ×4 IMPLANT
GOWN STRL REUS W/TWL LRG LVL3 (GOWN DISPOSABLE) ×4
HEMOSTAT ARISTA ABSORB 3G PWDR (HEMOSTASIS) ×1 IMPLANT
IRRIG SUCT STRYKERFLOW 2 WTIP (MISCELLANEOUS) ×2
IRRIGATION SUCT STRKRFLW 2 WTP (MISCELLANEOUS) ×1 IMPLANT
KIT TURNOVER KIT B (KITS) ×2 IMPLANT
NS IRRIG 1000ML POUR BTL (IV SOLUTION) ×2 IMPLANT
PACK LAPAROSCOPY BASIN (CUSTOM PROCEDURE TRAY) ×2 IMPLANT
PACK TRENDGUARD 450 HYBRID PRO (MISCELLANEOUS) ×1 IMPLANT
POUCH SPECIMEN RETRIEVAL 10MM (ENDOMECHANICALS) ×1 IMPLANT
PROTECTOR NERVE ULNAR (MISCELLANEOUS) ×4 IMPLANT
SET TUBE SMOKE EVAC HIGH FLOW (TUBING) ×2 IMPLANT
SHEARS HARMONIC ACE PLUS 36CM (ENDOMECHANICALS) ×1 IMPLANT
SLEEVE ENDOPATH XCEL 5M (ENDOMECHANICALS) ×2 IMPLANT
SUT MNCRL AB 4-0 PS2 18 (SUTURE) ×2 IMPLANT
SUT VICRYL 0 UR6 27IN ABS (SUTURE) ×4 IMPLANT
TOWEL GREEN STERILE FF (TOWEL DISPOSABLE) ×4 IMPLANT
TRAY FOLEY W/BAG SLVR 14FR (SET/KITS/TRAYS/PACK) ×2 IMPLANT
TRENDGUARD 450 HYBRID PRO PACK (MISCELLANEOUS) ×2
TROCAR BALLN 12MMX100 BLUNT (TROCAR) ×2 IMPLANT
TROCAR XCEL NON-BLD 5MMX100MML (ENDOMECHANICALS) ×2 IMPLANT

## 2022-02-06 NOTE — H&P (Addendum)
Tamara Waller is an 41 y.o. female P3 sent from the office for evaluation of ectopic pregnancy. Patient was seen at [facility] and found to have an empty uterus. She reports lower abdominal pain and back pain over the past few days. Patient is without any other complaints. This was not a planned pregnancy.  Pertinent Gynecological History: Menses: regular every month without intermenstrual spotting Contraception: none DES exposure: denies Blood transfusions: none Last pap: normal Date: 08/27/2019    Menstrual History: Patient's last menstrual period was 12/11/2021 (exact date).    Past Medical History:  Diagnosis Date   Asthma    Chlamydia    GERD (gastroesophageal reflux disease)    Ovarian cyst    Pyelonephritis    Trichomonas    UTI (urinary tract infection)     Past Surgical History:  Procedure Laterality Date   CARDIAC SURGERY     as newborn   HERNIA REPAIR      Family History  Problem Relation Age of Onset   Schizophrenia Mother     Social History:  reports that she has never smoked. She has never used smokeless tobacco. She reports that she does not drink alcohol and does not use drugs.  Allergies: No Known Allergies  Medications Prior to Admission  Medication Sig Dispense Refill Last Dose   cyanocobalamin (VITAMIN B12) 1000 MCG tablet Take 1,000 mcg by mouth daily.   02/06/2022   Multiple Vitamin (MULTIVITAMIN) tablet Take 1 tablet by mouth daily.   02/06/2022   VITAMIN D PO Take by mouth.   02/06/2022   acetaminophen (TYLENOL) 325 MG tablet Take 2 tablets (650 mg total) by mouth every 6 (six) hours as needed. (Patient not taking: Reported on 02/06/2022) 30 tablet 0    cyclobenzaprine (FLEXERIL) 10 MG tablet Take 1 tablet by mouth 3 times daily as needed for muscle spasm. Warning: May cause drowsiness. (Patient not taking: Reported on 02/06/2022) 21 tablet 0    ibuprofen (ADVIL) 800 MG tablet Take 1 tablet (800 mg total) by mouth 3 (three) times daily with  meals. (Patient not taking: Reported on 02/06/2022) 21 tablet 0 Not Taking   Lidocaine (HM LIDOCAINE PATCH) 4 % PTCH Apply 1 patch topically in the morning and at bedtime. (Patient not taking: Reported on 02/06/2022) 6 patch 0    norelgestromin-ethinyl estradiol Marilu Favre) 150-35 MCG/24HR transdermal patch Place 1 patch onto the skin once a week. (Patient not taking: Reported on 02/06/2022) 3 patch 12    zinc gluconate 50 MG tablet Take by mouth. (Patient not taking: Reported on 02/06/2022)       Review of Systems See pertinent in HPI Blood pressure 122/71, pulse 81, temperature 98.2 F (36.8 C), temperature source Oral, resp. rate 20, height 5' 2.5" (1.588 m), weight 81.6 kg, last menstrual period 12/11/2021, SpO2 99 %. Physical Exam GENERAL: Well-developed, well-nourished female in no acute distress.  HEENT: Normocephalic, atraumatic. Sclerae anicteric.  LUNGS: Clear to auscultation bilaterally.  HEART: Regular rate and rhythm. ABDOMEN: Soft, non distended, diffuse lower abdominal tenderness. No organomegaly. PELVIC: Deferred to OR EXTREMITIES: No cyanosis, clubbing, or edema, 2+ distal pulses.  Results for orders placed or performed during the hospital encounter of 02/06/22 (from the past 24 hour(s))  Pregnancy, urine POC     Status: Abnormal   Collection Time: 02/06/22  3:43 PM  Result Value Ref Range   Preg Test, Ur POSITIVE (A) NEGATIVE  Urinalysis, Routine w reflex microscopic Urine, Clean Catch     Status: Abnormal  Collection Time: 02/06/22  3:56 PM  Result Value Ref Range   Color, Urine YELLOW YELLOW   APPearance HAZY (A) CLEAR   Specific Gravity, Urine 1.013 1.005 - 1.030   pH 6.0 5.0 - 8.0   Glucose, UA NEGATIVE NEGATIVE mg/dL   Hgb urine dipstick SMALL (A) NEGATIVE   Bilirubin Urine NEGATIVE NEGATIVE   Ketones, ur NEGATIVE NEGATIVE mg/dL   Protein, ur NEGATIVE NEGATIVE mg/dL   Nitrite NEGATIVE NEGATIVE   Leukocytes,Ua NEGATIVE NEGATIVE   RBC / HPF 0-5 0 - 5 RBC/hpf    WBC, UA 0-5 0 - 5 WBC/hpf   Bacteria, UA RARE (A) NONE SEEN   Squamous Epithelial / LPF 0-5 0 - 5  CBC     Status: None   Collection Time: 02/06/22  4:03 PM  Result Value Ref Range   WBC 4.3 4.0 - 10.5 K/uL   RBC 4.42 3.87 - 5.11 MIL/uL   Hemoglobin 13.3 12.0 - 15.0 g/dL   HCT 38.7 36.0 - 46.0 %   MCV 87.6 80.0 - 100.0 fL   MCH 30.1 26.0 - 34.0 pg   MCHC 34.4 30.0 - 36.0 g/dL   RDW 12.5 11.5 - 15.5 %   Platelets 394 150 - 400 K/uL   nRBC 0.0 0.0 - 0.2 %  hCG, quantitative, pregnancy     Status: Abnormal   Collection Time: 02/06/22  4:03 PM  Result Value Ref Range   hCG, Beta Chain, Quant, S 4,969 (H) <5 mIU/mL  Comprehensive metabolic panel     Status: Abnormal   Collection Time: 02/06/22  4:03 PM  Result Value Ref Range   Sodium 137 135 - 145 mmol/L   Potassium 3.7 3.5 - 5.1 mmol/L   Chloride 102 98 - 111 mmol/L   CO2 25 22 - 32 mmol/L   Glucose, Bld 117 (H) 70 - 99 mg/dL   BUN 5 (L) 6 - 20 mg/dL   Creatinine, Ser 0.77 0.44 - 1.00 mg/dL   Calcium 9.2 8.9 - 10.3 mg/dL   Total Protein 7.2 6.5 - 8.1 g/dL   Albumin 3.6 3.5 - 5.0 g/dL   AST 25 15 - 41 U/L   ALT 21 0 - 44 U/L   Alkaline Phosphatase 59 38 - 126 U/L   Total Bilirubin 0.4 0.3 - 1.2 mg/dL   GFR, Estimated >60 >60 mL/min   Anion gap 10 5 - 15  Type and screen [place]     Status: None   Collection Time: 02/06/22  4:03 PM  Result Value Ref Range   ABO/RH(D) AB POS    Antibody Screen NEG    Sample Expiration      02/09/2022,2359 Performed at [facility], [address] [place], [place] [address]   Wet prep, genital     Status: Abnormal   Collection Time: 02/06/22  4:48 PM   Specimen: PATH Cytology Cervicovaginal Ancillary Only  Result Value Ref Range   Yeast Wet Prep HPF POC NONE SEEN NONE SEEN   Trich, Wet Prep NONE SEEN NONE SEEN   Clue Cells Wet Prep HPF POC PRESENT (A) NONE SEEN   WBC, Wet Prep HPF POC <10 <10   Sperm NONE SEEN     US OB LESS THAN 14 WEEKS WITH OB  TRANSVAGINAL  Result Date: 02/06/2022 CLINICAL DATA:  Pregnant patient with abdominal pain and spotting. EXAM: OBSTETRIC <14 WK [place] AND TRANSVAGINAL OB US TECHNIQUE: Both transabdominal and transvaginal ultrasound examinations were performed  for complete evaluation of the gestation as well as the maternal uterus, adnexal regions, and pelvic cul-de-sac. Transvaginal technique was performed to assess early pregnancy. COMPARISON:  Pelvic ultrasound 06/04/2018 reviewed FINDINGS: Intrauterine gestational sac: None Yolk sac:  Not Visualized. Embryo:  Not Visualized. Cardiac Activity: Not Visualized. Maternal uterus/adnexae: Heterogeneous left adnexal/cornual mass with both cystic and echogenic components measures 4.3 x 3.5 x 3.9 cm. Echogenic component is internal blood flow. This is discretely separate from the left ovary and highly suspicious for ectopic pregnancy. The left ovary with calcified shadowing dermoid is separate from this adnexal mass, ovarian blood flow is demonstrated. The endometrium is thin at 5 mm, no fluid in the endometrial canal. The right ovary is normal. Right ovarian blood flow is seen. There is no significant pelvic free fluid. IMPRESSION: 1. Heterogeneous left adnexal/cornual mass highly suspicious for ectopic pregnancy, measuring 4.3 x 3.5 x 3.9 cm. No free fluid to suggest rupture. This is separate from the left ovary. 2. No intrauterine pregnancy. 3. 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 No evidence of ovarian torsion, ovarian blood flow is demonstrated. Critical Value/emergent results were called by telephone at the time of interpretation on 02/06/2022 at 5:28 pm to provider at the MAU, who verbally acknowledged these results. Electronically Signed   By: Keith Rake M.D.   On: 02/06/2022 17:29    Assessment/Plan: 41 yo P3 with radiologic findings concerning for cornual ectopic pregnancy - Reviewed ultrasound results with the patient - Discussed surgical management with laparoscopic  salpingectomy. Risks, benefits and alternatives were explained including but not limited to risks of bleeding, infection and damage to adjacent organs. Patient also consented for possible laparotomy. Patient verbalized understanding and all questions were answered - Due to the significant amount of lower abdominal discomfort, I requested an emergency surgery for fear of impeding rupture.   Brianna Bennett 02/06/2022, 6:17 PM

## 2022-02-06 NOTE — MAU Provider Note (Signed)
History     CSN: [id number]  Arrival date and time: 02/06/22 1526   None    No chief complaint on file.  HPI Tamara Waller is a 41 y.o. [phone] at [id number] LMP who presents from the office for r/o ectopic pregnancy. Patient reports red/brown spotting and lower abdominal and back pain that started approximately 2 weeks ago. She reports pain is an intermittent, sharp/dull sensation. She currently rates her pain 8/10. She has been taking Tylenol without any relief. She denies urinary s/s, vaginal itching, odor, fever, or constipation/diarrhea. she went to [facility] today and was told that they could not see anything on ultrasound. [facility]. LMP was 12/11/21.   OB History     Gravida  4   Para  3   Term  2   Preterm  1   AB  0   Living  3      SAB  0   IAB  0   Ectopic  0   Multiple  0   Live Births  3           Past Medical History:  Diagnosis Date   Asthma    Chlamydia    GERD (gastroesophageal reflux disease)    Ovarian cyst    Pyelonephritis    Trichomonas    UTI (urinary tract infection)     Past Surgical History:  Procedure Laterality Date   CARDIAC SURGERY     as newborn   HERNIA REPAIR      Family History  Problem Relation Age of Onset   Schizophrenia Mother     Social History   Tobacco Use   Smoking status: Never   Smokeless tobacco: Never  Vaping Use   Vaping Use: Never used  Substance Use Topics   Alcohol use: No    Alcohol/week: 0.0 standard drinks of alcohol   Drug use: No    Allergies: No Known Allergies  Medications Prior to Admission  Medication Sig Dispense Refill Last Dose   cyanocobalamin (VITAMIN B12) 1000 MCG tablet Take 1,000 mcg by mouth daily.   02/06/2022   Multiple Vitamin (MULTIVITAMIN) tablet Take 1 tablet by mouth daily.   02/06/2022   VITAMIN D PO Take by mouth.   02/06/2022   acetaminophen (TYLENOL) 325 MG tablet Take 2 tablets (650  mg total) by mouth every 6 (six) hours as needed. (Patient not taking: Reported on 02/06/2022) 30 tablet 0    cyclobenzaprine (FLEXERIL) 10 MG tablet Take 1 tablet by mouth 3 times daily as needed for muscle spasm. Warning: May cause drowsiness. (Patient not taking: Reported on 02/06/2022) 21 tablet 0    ibuprofen (ADVIL) 800 MG tablet Take 1 tablet (800 mg total) by mouth 3 (three) times daily with meals. (Patient not taking: Reported on 02/06/2022) 21 tablet 0 Not Taking   Lidocaine (HM LIDOCAINE PATCH) 4 % PTCH Apply 1 patch topically in the morning and at bedtime. (Patient not taking: Reported on 02/06/2022) 6 patch 0    norelgestromin-ethinyl estradiol (Marilu Favre 150-35 MCG/24HR transdermal patch Place 1 patch onto the skin once a week. (Patient not taking: Reported on 02/06/2022) 3 patch 12    zinc gluconate 50 MG tablet Take by mouth. (Patient not taking: Reported on 02/06/2022)       Review of Systems  Constitutional: Negative.   Respiratory: Negative.    Cardiovascular: Negative.   Gastrointestinal:  Positive for  abdominal pain.  Genitourinary:  Positive for vaginal bleeding (spotting).  Musculoskeletal:  Positive for back pain.  Neurological: Negative.    Physical Exam   Blood pressure 122/71, pulse 81, temperature 98.2 F (36.8 C), temperature source Oral, resp. rate 20, height 5' 2.5" (1.588 m), weight 81.6 kg, last menstrual period 12/11/2021, SpO2 99 %.  Physical Exam Vitals and nursing note reviewed.  Constitutional:      General: She is not in acute distress. Eyes:     Extraocular Movements: Extraocular movements intact.     Pupils: Pupils are equal, round, and reactive to light.  Cardiovascular:     Rate and Rhythm: Normal rate.  Pulmonary:     Effort: Pulmonary effort is normal.  Abdominal:     Palpations: Abdomen is soft.     Tenderness: There is abdominal tenderness in the right lower quadrant, suprapubic area and left lower quadrant. There is no right CVA tenderness,  left CVA tenderness, guarding or rebound.  Genitourinary:    Comments: Blind swabs collected by RN Musculoskeletal:        General: Normal range of motion.     Cervical back: Normal range of motion.  Skin:    General: Skin is warm and dry.  Neurological:     General: No focal deficit present.     Mental Status: She is alert and oriented to person, place, and time.  Psychiatric:        Mood and Affect: Mood normal.        Behavior: Behavior normal.    US OB LESS THAN 14 WEEKS WITH OB TRANSVAGINAL  Result Date: 02/06/2022 CLINICAL DATA:  Pregnant patient with abdominal pain and spotting. EXAM: OBSTETRIC <14 WK [place] AND TRANSVAGINAL OB US TECHNIQUE: Both transabdominal and transvaginal ultrasound examinations were performed for complete evaluation of the gestation as well as the maternal uterus, adnexal regions, and pelvic cul-de-sac. Transvaginal technique was performed to assess early pregnancy. COMPARISON:  Pelvic ultrasound 06/04/2018 reviewed FINDINGS: Intrauterine gestational sac: None Yolk sac:  Not Visualized. Embryo:  Not Visualized. Cardiac Activity: Not Visualized. Maternal uterus/adnexae: Heterogeneous left adnexal/cornual mass with both cystic and echogenic components measures 4.3 x 3.5 x 3.9 cm. Echogenic component is internal blood flow. This is discretely separate from the left ovary and highly suspicious for ectopic pregnancy. The left ovary with calcified shadowing dermoid is separate from this adnexal mass, ovarian blood flow is demonstrated. The endometrium is thin at 5 mm, no fluid in the endometrial canal. The right ovary is normal. Right ovarian blood flow is seen. There is no significant pelvic free fluid. IMPRESSION: 1. Heterogeneous left adnexal/cornual mass highly suspicious for ectopic pregnancy, measuring 4.3 x 3.5 x 3.9 cm. No free fluid to suggest rupture. This is separate from the left ovary. 2. No intrauterine pregnancy. 3. 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 No evidence of  ovarian torsion, ovarian blood flow is demonstrated. Critical Value/emergent results were called by telephone at the time of interpretation on 02/06/2022 at 5:28 pm to provider at the [facility]U, who verbally acknowledged these results. Electronically Signed   By: Keith Rake M.D.   On: 02/06/2022 17:29    MAU Course  Procedures  MDM UA, culture pending CBC, CMP, HCG, T&S- stable, blood type AB pos, rhophylac not indicated Wet prep negative, GC/CT pending Ultrasound with results as above. Left cornual ectopic, no IUP Patient last ate at 4pm. Patient has been NPO since arrival to MAU Dr Elly Modena notified and to consent patient for surgical intervention  Assessment and Plan  Left cornual ectopic pregnancy  - Dr. Elly Modena to assume care of patient. - Patient to [place], CNM 02/06/2022, 6:06 PM

## 2022-02-06 NOTE — Progress Notes (Signed)
Pt presents today with ovarian cyst and possible ectopic pregnancy. Pt has been having pain in her lower back and abdomen, and states it is worse at night. LMP was 12/11/21. Went to another doctor office and they told her they could not confirm her pregnancy. Pt reports having a positive UPT at home.

## 2022-02-06 NOTE — Anesthesia Preprocedure Evaluation (Signed)
Anesthesia Evaluation  Patient identified by MRN, date of birth, ID band Patient awake    Reviewed: Allergy & Precautions, NPO status , Patient's Chart, lab work & pertinent test results  Airway Mallampati: II  TM Distance: >3 FB Neck ROM: Full    Dental no notable dental hx.    Pulmonary asthma ,    Pulmonary exam normal        Cardiovascular negative cardio ROS Normal cardiovascular exam     Neuro/Psych negative neurological ROS  negative psych ROS   GI/Hepatic Neg liver ROS, GERD  Medicated and Controlled,  Endo/Other  negative endocrine ROS  Renal/GU negative Renal ROS     Musculoskeletal negative musculoskeletal ROS (+)   Abdominal   Peds  Hematology negative hematology ROS (+)   Anesthesia Other Findings Ectopic pregnancy  Reproductive/Obstetrics                             Anesthesia Physical Anesthesia Plan  ASA: 2 and emergent  Anesthesia Plan: General   Post-op Pain Management:    Induction: Intravenous and Rapid sequence  PONV Risk Score and Plan: 4 or greater and Ondansetron, Dexamethasone, Midazolam, Treatment may vary due to age or medical condition and Scopolamine patch - Pre-op  Airway Management Planned: Oral ETT  Additional Equipment:   Intra-op Plan:   Post-operative Plan: Extubation in OR  Informed Consent: I have reviewed the patients History and Physical, chart, labs and discussed the procedure including the risks, benefits and alternatives for the proposed anesthesia with the patient or authorized representative who has indicated his/her understanding and acceptance.     Dental advisory given  Plan Discussed with: CRNA  Anesthesia Plan Comments: (Surgery declared an emergency by surgeon. )        Anesthesia Quick Evaluation

## 2022-02-06 NOTE — Progress Notes (Signed)
   Subjective:    Patient ID: Tamara Waller is a 41 y.o. female presenting with No chief complaint on file.  on 02/06/2022  HPI: Patient reports LMP in July. Went to [facility] today and was told she might have an ectopic. She reports spotting and back pain and significant abdominal pain.  Review of Systems  Constitutional:  Negative for chills and fever.  Respiratory:  Negative for shortness of breath.   Cardiovascular:  Negative for chest pain.  Gastrointestinal:  Positive for abdominal pain. Negative for nausea and vomiting.  Genitourinary:  Positive for vaginal bleeding. Negative for dysuria.  Musculoskeletal:  Positive for back pain.  Skin:  Negative for rash.      Objective:    BP 123/78   Pulse 93   Ht 5' 2.5" (1.588 m)   Wt 179 lb (81.2 kg)   LMP 12/11/2021 (Exact Date)   BMI 32.22 kg/m  Physical Exam Constitutional:      General: She is not in acute distress.    Appearance: She is well-developed.  HENT:     Head: Normocephalic and atraumatic.  Eyes:     General: No scleral icterus. Cardiovascular:     Rate and Rhythm: Normal rate.  Pulmonary:     Effort: Pulmonary effort is normal.  Abdominal:     Palpations: Abdomen is soft.     Tenderness: There is abdominal tenderness (diffusely in lower quandrants). There is no guarding or rebound.  Musculoskeletal:     Cervical back: Neck supple.  Skin:    General: Skin is warm and dry.  Neurological:     Mental Status: She is alert and oriented to person, place, and time.   Has u/s images from a [facility] today which reveal thin endometrial stripe.  UPT Positive     Assessment & Plan:   Problem List Items Addressed This Visit       Unprioritized   Pregnancy of unknown anatomic location    Given pain and bleeding, and negative u/s with possible ectopic, will have patient go to MAU for w/u and treatment if needed.       Other Visit Diagnoses     Amenorrhea    -  Primary   Relevant  Orders   POCT urine pregnancy       Return if symptoms worsen or fail to improve.  Donnamae Jude, MD 02/06/2022 1:20 PM

## 2022-02-06 NOTE — Transfer of Care (Addendum)
Immediate Anesthesia Transfer of Care Note  Patient: Tamara Waller  Procedure(s) Performed: LAPAROSCOPIC LEFT SALPINGECTOMY WITH REMOVAL OF ECTOPIC PREGNANCY (Left)  Patient Location: PACU  Anesthesia Type:General  Level of Consciousness: awake and alert   Airway & Oxygen Therapy: Patient Spontanous Breathing and Patient connected to face mask oxygen  Post-op Assessment: Report given to RN and Post -op Vital signs reviewed and stable  Post vital signs: Reviewed and stable  Last Vitals:  Vitals Value Taken Time  BP 121/68 02/06/22 2115  Temp    Pulse 80 02/06/22 2121  Resp 32 02/06/22 2121  SpO2 96 % 02/06/22 2121  Vitals shown include unvalidated device data.  Last Pain:  Vitals:   02/06/22 1837  TempSrc:   PainSc: 8          Complications: No notable events documented.

## 2022-02-06 NOTE — H&P (Signed)
History     CSN: [id number]  Arrival date and time: 02/06/22 1526   None    No chief complaint on file.  HPI Tamara Waller is a 41 y.o. [phone] at 49w1dby LMP who presents from the office for r/o ectopic pregnancy. Patient reports red/brown spotting and lower abdominal and back pain that started approximately 2 weeks ago. She reports pain is an intermittent, sharp/dull sensation. She currently rates her pain 8/10. She has been taking Tylenol without any relief. She denies urinary s/s, vaginal itching, odor, fever, or constipation/diarrhea. she went to [facility] today and was told that they could not see anything on ultrasound. She was then seen at [facility] subsequently sent to [facility] for r/o ectopic. LMP was 12/11/21.   OB History     Gravida  4   Para  3   Term  2   Preterm  1   AB  0   Living  3      SAB  0   IAB  0   Ectopic  0   Multiple  0   Live Births  3           Past Medical History:  Diagnosis Date   Asthma    Chlamydia    GERD (gastroesophageal reflux disease)    Ovarian cyst    Pyelonephritis    Trichomonas    UTI (urinary tract infection)     Past Surgical History:  Procedure Laterality Date   CARDIAC SURGERY     as newborn   HERNIA REPAIR      Family History  Problem Relation Age of Onset   Schizophrenia Mother     Social History   Tobacco Use   Smoking status: Never   Smokeless tobacco: Never  Vaping Use   Vaping Use: Never used  Substance Use Topics   Alcohol use: No    Alcohol/week: 0.0 standard drinks of alcohol   Drug use: No    Allergies: No Known Allergies  Medications Prior to Admission  Medication Sig Dispense Refill Last Dose   cyanocobalamin (VITAMIN B12) 1000 MCG tablet Take 1,000 mcg by mouth daily.   02/06/2022   Multiple Vitamin (MULTIVITAMIN) tablet Take 1 tablet by mouth daily.   02/06/2022   VITAMIN D PO Take by mouth.   02/06/2022   acetaminophen (TYLENOL) 325 MG tablet Take 2 tablets (650  mg total) by mouth every 6 (six) hours as needed. (Patient not taking: Reported on 02/06/2022) 30 tablet 0    cyclobenzaprine (FLEXERIL) 10 MG tablet Take 1 tablet by mouth 3 times daily as needed for muscle spasm. Warning: May cause drowsiness. (Patient not taking: Reported on 02/06/2022) 21 tablet 0    ibuprofen (ADVIL) 800 MG tablet Take 1 tablet (800 mg total) by mouth 3 (three) times daily with meals. (Patient not taking: Reported on 02/06/2022) 21 tablet 0 Not Taking   Lidocaine (HM LIDOCAINE PATCH) 4 % PTCH Apply 1 patch topically in the morning and at bedtime. (Patient not taking: Reported on 02/06/2022) 6 patch 0    norelgestromin-ethinyl estradiol (Marilu Favre 150-35 MCG/24HR transdermal patch Place 1 patch onto the skin once a week. (Patient not taking: Reported on 02/06/2022) 3 patch 12    zinc gluconate 50 MG tablet Take by mouth. (Patient not taking: Reported on 02/06/2022)       Review of Systems  Constitutional: Negative.   Respiratory: Negative.    Cardiovascular: Negative.   Gastrointestinal:  Positive for  abdominal pain.  Genitourinary:  Positive for vaginal bleeding (spotting).  Musculoskeletal:  Positive for back pain.  Neurological: Negative.    Physical Exam   Blood pressure 122/71, pulse 81, temperature 98.2 F (36.8 C), temperature source Oral, resp. rate 20, height 5' 2.5" (1.588 m), weight 81.6 kg, last menstrual period 12/11/2021, SpO2 99 %.  Physical Exam Vitals and nursing note reviewed.  Constitutional:      General: She is not in acute distress. Eyes:     Extraocular Movements: Extraocular movements intact.     Pupils: Pupils are equal, round, and reactive to light.  Cardiovascular:     Rate and Rhythm: Normal rate.  Pulmonary:     Effort: Pulmonary effort is normal.  Abdominal:     Palpations: Abdomen is soft.     Tenderness: There is abdominal tenderness in the right lower quadrant, suprapubic area and left lower quadrant. There is no right CVA tenderness,  left CVA tenderness, guarding or rebound.  Genitourinary:    Comments: Blind swabs collected by RN Musculoskeletal:        General: Normal range of motion.     Cervical back: Normal range of motion.  Skin:    General: Skin is warm and dry.  Neurological:     General: No focal deficit present.     Mental Status: She is alert and oriented to person, place, and time.  Psychiatric:        Mood and Affect: Mood normal.        Behavior: Behavior normal.   Recent Results (from the past 2160 hour(s))  Pregnancy, urine POC     Status: Abnormal   Collection Time: 02/06/22  3:43 PM  Result Value Ref Range   Preg Test, Ur POSITIVE (A) NEGATIVE    Comment:        THE SENSITIVITY OF THIS METHODOLOGY IS >24 mIU/mL   Urinalysis, Routine w reflex microscopic Urine, Clean Catch     Status: Abnormal   Collection Time: 02/06/22  3:56 PM  Result Value Ref Range   Color, Urine YELLOW YELLOW   APPearance HAZY (A) CLEAR   Specific Gravity, Urine 1.013 1.005 - 1.030   pH 6.0 5.0 - 8.0   Glucose, UA NEGATIVE NEGATIVE mg/dL   Hgb urine dipstick SMALL (A) NEGATIVE   Bilirubin Urine NEGATIVE NEGATIVE   Ketones, ur NEGATIVE NEGATIVE mg/dL   Protein, ur NEGATIVE NEGATIVE mg/dL   Nitrite NEGATIVE NEGATIVE   Leukocytes,Ua NEGATIVE NEGATIVE   RBC / HPF 0-5 0 - 5 RBC/hpf   WBC, UA 0-5 0 - 5 WBC/hpf   Bacteria, UA RARE (A) NONE SEEN   Squamous Epithelial / LPF 0-5 0 - 5    Comment: Performed at [facility], [address] [place] [address]  CBC     Status: None   Collection Time: 02/06/22  4:03 PM  Result Value Ref Range   WBC 4.3 4.0 - 10.5 K/uL   RBC 4.42 3.87 - 5.11 MIL/uL   Hemoglobin 13.3 12.0 - 15.0 g/dL   HCT 38.7 36.0 - 46.0 %   MCV 87.6 80.0 - 100.0 fL   MCH 30.1 26.0 - 34.0 pg   MCHC 34.4 30.0 - 36.0 g/dL   RDW 12.5 11.5 - 15.5 %   Platelets 394 150 - 400 K/uL   nRBC 0.0 0.0 - 0.2 %    Comment: Performed at [facility], [place] [address], [place], [place] [address]   hCG,  quantitative, pregnancy     Status: Abnormal   Collection Time: 02/06/22  4:03 PM  Result Value Ref Range   hCG, Beta Chain, Quant, S 4,969 (H) <5 mIU/mL    Comment:          GEST. AGE      CONC.  (mIU/mL)   <=1 WEEK        5 - 50     2 WEEKS       50 - 500     3 WEEKS       100 - 10,000     4 WEEKS     1,000 - 30,000     5 WEEKS     3,500 - 115,000   6-8 WEEKS     12,000 - 270,000    12 WEEKS     15,000 - 220,000        FEMALE AND NON-PREGNANT FEMALE:     LESS THAN 5 mIU/mL Performed at [facility], [place] [address], [place], [place] [address]   Comprehensive metabolic panel     Status: Abnormal   Collection Time: 02/06/22  4:03 PM  Result Value Ref Range   Sodium 137 135 - 145 mmol/L   Potassium 3.7 3.5 - 5.1 mmol/L   Chloride 102 98 - 111 mmol/L   CO2 25 22 - 32 mmol/L   Glucose, Bld 117 (H) 70 - 99 mg/dL    Comment: Glucose reference range applies only to samples taken after fasting for at least 8 hours.   BUN 5 (L) 6 - 20 mg/dL   Creatinine, Ser 0.77 0.44 - 1.00 mg/dL   Calcium 9.2 8.9 - 10.3 mg/dL   Total Protein 7.2 6.5 - 8.1 g/dL   Albumin 3.6 3.5 - 5.0 g/dL   AST 25 15 - 41 U/L   ALT 21 0 - 44 U/L   Alkaline Phosphatase 59 38 - 126 U/L   Total Bilirubin 0.4 0.3 - 1.2 mg/dL   GFR, Estimated >60 >60 mL/min    Comment: (NOTE) Calculated using the CKD-EPI Creatinine Equation (2021)    Anion gap 10 5 - 15    Comment: Performed at [facility] [address] [place], [place] [address]  Type and screen [place]     Status: None   Collection Time: 02/06/22  4:03 PM  Result Value Ref Range   ABO/RH(D) AB POS    Antibody Screen NEG    Sample Expiration      02/09/2022,2359 Performed at [facility], [place] [address], [place], [place] [address]   POCT urine pregnancy     Status: Abnormal   Collection Time: 02/06/22  4:38 PM  Result Value Ref Range   Preg Test, Ur Positive (A) Negative  Wet prep, genital     Status:  Abnormal   Collection Time: 02/06/22  4:48 PM   Specimen: PATH Cytology Cervicovaginal Ancillary Only  Result Value Ref Range   Yeast Wet Prep HPF POC NONE SEEN NONE SEEN   Trich, Wet Prep NONE SEEN NONE SEEN   Clue Cells Wet Prep HPF POC PRESENT (A) NONE SEEN   WBC, Wet Prep HPF POC <10 <10   Sperm NONE SEEN     Comment: Performed at [facility], [place] [address], [place], [place] [address]   US OB LESS THAN 14 WEEKS WITH [place] TRANSVAGINAL  Result Date: 02/06/2022 CLINICAL DATA:  Pregnant patient with abdominal pain  and spotting. EXAM: OBSTETRIC <14 WK [place] AND TRANSVAGINAL OB US TECHNIQUE: Both transabdominal and transvaginal ultrasound examinations were performed for complete evaluation of the gestation as well as the maternal uterus, adnexal regions, and pelvic cul-de-sac. Transvaginal technique was performed to assess early pregnancy. COMPARISON:  Pelvic ultrasound 06/04/2018 reviewed FINDINGS: Intrauterine gestational sac: None Yolk sac:  Not Visualized. Embryo:  Not Visualized. Cardiac Activity: Not Visualized. Maternal uterus/adnexae: Heterogeneous left adnexal/cornual mass with both cystic and echogenic components measures 4.3 x 3.5 x 3.9 cm. Echogenic component is internal blood flow. This is discretely separate from the left ovary and highly suspicious for ectopic pregnancy. The left ovary with calcified shadowing dermoid is separate from this adnexal mass, ovarian blood flow is demonstrated. The endometrium is thin at 5 mm, no fluid in the endometrial canal. The right ovary is normal. Right ovarian blood flow is seen. There is no significant pelvic free fluid. IMPRESSION: 1. Heterogeneous left adnexal/cornual mass highly suspicious for ectopic pregnancy, measuring 4.3 x 3.5 x 3.9 cm. No free fluid to suggest rupture. This is separate from the left ovary. 2. No intrauterine pregnancy. 3. 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 No evidence of ovarian torsion, ovarian blood flow is demonstrated.  Critical Value/emergent results were called by telephone at the time of interpretation on 02/06/2022 at 5:28 pm to provider at the MAU, who verbally acknowledged these results. Electronically Signed   By: Keith Rake M.D.   On: 02/06/2022 17:29    MAU Course  Procedures  MDM UA, culture pending CBC, CMP, HCG, T&S- stable, blood type AB pos, rhophylac not indicated Wet prep negative, GC/CT pending Ultrasound with results as above. Left cornual ectopic, no IUP Patient last ate at 4pm. Patient has been NPO since arrival to MAU Dr Constant notified and to consent patient for surgical intervention   Assessment and Plan  Left cornual ectopic pregnancy  - Dr. Elly Modena to assume care of patient. - Patient to [place], CNM 02/06/2022, 6:10 PM

## 2022-02-06 NOTE — Op Note (Signed)
[place] [facility] PROCEDURE DATE: 02/06/2022  PREOPERATIVE DIAGNOSIS: left cornual ectopic pregnancy POSTOPERATIVE DIAGNOSIS: Cornual left  tube ectopic pregnancy PROCEDURE: Laparoscopic left salpingectomy and removal of ectopic pregnancy SURGEON:  Dr. Mora Bellman ASSISTANT: none ANESTHESIOLOGIST: Ellender, Karyl Kinnier, MD Anesthesiologist: Murvin Natal, MD CRNA: Georgia Duff, CRNA; Reece Agar, CRNA  INDICATIONS: 41 y.o. [phone] at 33w1dhere  with ultrasound findings concerning for a cornual ectopic pregnancy. On exam, she had stable vital signs, and an acute abdomen. Hgb  Lab Results  Component Value Date   HGB 13.3 02/06/2022   , blood type AB POS . Patient was counseled regarding need for laparoscopic salpingectomy. Risks of surgery including bleeding which may require transfusion or reoperation, infection, injury to bowel or other surrounding organs, need for additional procedures including laparotomy and other postoperative/anesthesia complications were explained to patient.  Written informed consent was obtained.  FINDINGS:  Dilated left fallopian tube containing ectopic gestation extending near cornual region. Small normal appearing uterus, normal right fallopian tube, left ovary and right ovary.  ANESTHESIA: General INTRAVENOUS FLUIDS: .1000 ml ESTIMATED BLOOD LOSS:  50 mL  URINE OUTPUT: 800 ml SPECIMENS: left fallopian tube containing ectopic gestation COMPLICATIONS: None immediate  PROCEDURE IN DETAIL:  The patient was taken to the operating room where general anesthesia was administered and was found to be adequate.  She was placed in the dorsal lithotomy position, and was prepped and draped in a sterile manner.  A Foley catheter was inserted into her bladder and attached to Bernestine Holsapple drainage and a uterine manipulator was then advanced into the uterus .  After an adequate timeout was performed, attention was then turned to the patient's abdomen where a 10-mm skin  incision was made on the umbilical fold.  The fascia was identified, tented up and incised with Mayo scissors. The fascia was tagged with 0- Vicryl. The peritoneum was identified tented up and entered sharply with Metzenbaum scissors.  10-mm trocar and sleeve were then advanced without difficulty into the abdomen where intraabdominal placement was confirmed by the laparoscope. Pneumoperitoneum was achieved with insufflation of CO2 gas. A survey of the patient's pelvis and abdomen revealed the findings as above.  Two 5-mm right lower quadrant ports were placed under direct visualization.  Attention was then turned to the left fallopian tube which was grasped and ligated from the underlying mesosalpinx and uterine attachment using the Harmonic scalpel instrument.  Good hemostasis was noted.  The specimen was placed in an EndoCatch bag and removed from the abdomen intact.  Left cornual region was found to be hemostatic after decreasing intraabdominal pressure. To ensure persistence of hemostasis, Arista was applied over the left cornual region. The abdomen was desufflated, and all instruments were removed.  The fascial incisions of both 10-mm sites were reapproximated with 0 Vicryl figure-of-eight stiches; and all skin incisions were closed with a 3-0 Vicryl subcuticular stitch. The patient tolerated the procedures well.  All instruments, needles, and sponge counts were correct x 2. The patient was taken to the recovery room in stable condition.   The patient will be discharged to home as per PACU criteria.  Routine postoperative instructions given.  She was prescribed Percocet, Ibuprofen and Colace.  She will follow up in the clinic in 2 weeks for postoperative evaluation .

## 2022-02-06 NOTE — Assessment & Plan Note (Signed)
Given pain and bleeding, and negative u/s with possible ectopic, will have patient go to MAU for w/u and treatment if needed.

## 2022-02-06 NOTE — Anesthesia Procedure Notes (Signed)
Procedure Name: Intubation Date/Time: 02/06/2022 7:44 PM  Performed by: Georgia Duff, CRNAPre-anesthesia Checklist: Patient identified, Emergency Drugs available, Suction available and Patient being monitored Patient Re-evaluated:Patient Re-evaluated prior to induction Oxygen Delivery Method: Circle System Utilized Preoxygenation: Pre-oxygenation with 100% oxygen Induction Type: IV induction Ventilation: Mask ventilation without difficulty Laryngoscope Size: Miller and 2 Grade View: Grade I Tube type: Oral Tube size: 7.0 mm Number of attempts: 1 Airway Equipment and Method: Stylet and Oral airway Placement Confirmation: ETT inserted through vocal cords under direct vision, positive ETCO2 and breath sounds checked- equal and bilateral Secured at: 23 cm Tube secured with: Tape Dental Injury: Teeth and Oropharynx as per pre-operative assessment

## 2022-02-06 NOTE — MAU Note (Signed)
Tamara Waller is a 41 y.o. at Unknown here in MAU reporting: she was sent from [place] to to determine if she has an ectopic pregnancy.  Reports currently spotting. LMP: 12/11/2021 Onset of complaint: today Pain score: 8 Vitals:   02/06/22 1607  BP: 122/71  Pulse: 81  Resp: 20  Temp: 98.2 F (36.8 C)  SpO2: 99%     FHT:N/A Lab orders placed from triage:   UPT & UA

## 2022-02-07 ENCOUNTER — Encounter (HOSPITAL_COMMUNITY): Payer: Self-pay | Admitting: Obstetrics and Gynecology

## 2022-02-07 NOTE — Anesthesia Postprocedure Evaluation (Signed)
Anesthesia Post Note  Patient: Tamara Waller  Procedure(s) Performed: LAPAROSCOPIC LEFT SALPINGECTOMY WITH REMOVAL OF ECTOPIC PREGNANCY (Left)     Patient location during evaluation: PACU Anesthesia Type: General Level of consciousness: awake Pain management: pain level controlled Vital Signs Assessment: post-procedure vital signs reviewed and stable Respiratory status: spontaneous breathing, nonlabored ventilation, respiratory function stable and patient connected to nasal cannula oxygen Cardiovascular status: blood pressure returned to baseline and stable Postop Assessment: no apparent nausea or vomiting Anesthetic complications: no   No notable events documented.  Last Vitals:  Vitals:   02/06/22 2230 02/06/22 2240  BP: 111/64 112/65  Pulse: 65 66  Resp: (!) 21 19  Temp:  36.7 C  SpO2: 92% 98%    Last Pain:  Vitals:   02/06/22 2145  TempSrc:   PainSc: 5                  Lathaniel Legate P Emmarae Cowdery

## 2022-02-08 LAB — GC/CHLAMYDIA PROBE AMP (~~LOC~~) NOT AT ARMC
Chlamydia: NEGATIVE
Comment: NEGATIVE
Comment: NORMAL
Neisseria Gonorrhea: NEGATIVE

## 2022-02-08 LAB — SURGICAL PATHOLOGY

## 2022-02-10 LAB — CULTURE, OB URINE

## 2022-02-22 ENCOUNTER — Encounter: Payer: Self-pay | Admitting: Obstetrics and Gynecology

## 2022-02-22 ENCOUNTER — Ambulatory Visit (INDEPENDENT_AMBULATORY_CARE_PROVIDER_SITE_OTHER): Payer: Medicaid Other | Admitting: Obstetrics and Gynecology

## 2022-02-22 VITALS — BP 121/77 | HR 87 | Wt 177.0 lb

## 2022-02-22 DIAGNOSIS — O008 Other ectopic pregnancy without intrauterine pregnancy: Secondary | ICD-10-CM

## 2022-02-22 DIAGNOSIS — Z01818 Encounter for other preprocedural examination: Secondary | ICD-10-CM

## 2022-02-22 DIAGNOSIS — Z1231 Encounter for screening mammogram for malignant neoplasm of breast: Secondary | ICD-10-CM

## 2022-02-22 DIAGNOSIS — Z9889 Other specified postprocedural states: Secondary | ICD-10-CM

## 2022-02-22 MED ORDER — NORETHINDRONE 0.35 MG PO TABS: 1.0000 | ORAL_TABLET | Freq: Every day | ORAL | 11 refills | Status: DC

## 2022-02-22 NOTE — Progress Notes (Signed)
Pt states she is having back pain that started yesterday.

## 2022-02-22 NOTE — Progress Notes (Signed)
41 yo here for post op check following a laparoscopic salpingectomy for the treatment of an ectopic pregnancy on 02/06/22. Patient reports feeling well. She denies any drainage from her incisions or fever. She has mostly returned to her regular activities. Patient is interested in permanent sterilization.   Past Medical History:  Diagnosis Date   Asthma    Chlamydia    GERD (gastroesophageal reflux disease)    Ovarian cyst    Pyelonephritis    Trichomonas    UTI (urinary tract infection)    Past Surgical History:  Procedure Laterality Date   CARDIAC SURGERY     as newborn   HERNIA REPAIR     LAPAROSCOPIC UNILATERAL SALPINGECTOMY Left 02/06/2022   Procedure: LAPAROSCOPIC LEFT SALPINGECTOMY WITH REMOVAL OF ECTOPIC PREGNANCY;  Surgeon: Mora Bellman, MD;  Location: [place];  Service: Gynecology;  Laterality: Left;   Family History  Problem Relation Age of Onset   Schizophrenia Mother    Social History   Tobacco Use   Smoking status: Never   Smokeless tobacco: Never  Vaping Use   Vaping Use: Never used  Substance Use Topics   Alcohol use: No    Alcohol/week: 0.0 standard drinks of alcohol   Drug use: No   ROS See pertinent in HPI. All other systems reviewed and non contributory Blood pressure 121/77, pulse 87, weight 177 lb (80.3 kg), last menstrual period 12/11/2021. GENERAL: Well-developed, well-nourished female in no acute distress.  ABDOMEN: Soft, nontender, nondistended. No organomegaly. PELVIC: Not indicated EXTREMITIES: No cyanosis, clubbing, or edema, 2+ distal pulses.  A/P 41 yo here for post op check - quant HCG today given that it was a cornual ectopic - Patient desires permanent sterilization. Other reversible forms of contraception were discussed with patient; she declines all other modalities.  Risks of procedure discussed with patient including permanence of method, bleeding, infection, injury to surrounding organs and need for additional procedures including  laparotomy, risk of regret.  Failure risk of 0.5-1% with increased risk of ectopic gestation if pregnancy occurs was also discussed with patient.   - Rx pills until BTL - Screening mammogram ordered

## 2022-02-23 LAB — BETA HCG QUANT (REF LAB): hCG Quant: 3 m[IU]/mL

## 2022-04-17 ENCOUNTER — Encounter (HOSPITAL_COMMUNITY): Payer: Self-pay | Admitting: *Deleted

## 2022-04-17 NOTE — Progress Notes (Signed)
Per patient, surgery has been re-scheduled to sometime in January. IB message sent to [facility] for confirmation.

## 2022-04-24 ENCOUNTER — Ambulatory Visit (HOSPITAL_BASED_OUTPATIENT_CLINIC_OR_DEPARTMENT_OTHER)
Admission: RE | Admit: 2022-04-24 | Payer: Medicaid Other | Source: Ambulatory Visit | Admitting: Obstetrics and Gynecology

## 2022-04-24 SURGERY — SALPINGECTOMY, BILATERAL, LAPAROSCOPIC
Anesthesia: Choice | Laterality: Bilateral

## 2022-06-14 ENCOUNTER — Ambulatory Visit: Payer: Medicaid Other

## 2022-06-21 ENCOUNTER — Inpatient Hospital Stay: Admission: RE | Admit: 2022-06-21 | Payer: Medicaid Other | Source: Ambulatory Visit

## 2022-06-27 ENCOUNTER — Encounter: Payer: Self-pay | Admitting: Obstetrics

## 2022-06-27 ENCOUNTER — Other Ambulatory Visit (HOSPITAL_COMMUNITY)
Admission: RE | Admit: 2022-06-27 | Discharge: 2022-06-27 | Disposition: A | Discharge status: Home / Self Care | Payer: Medicaid Other | Source: Ambulatory Visit | Attending: Medical | Admitting: Medical

## 2022-06-27 ENCOUNTER — Ambulatory Visit (INDEPENDENT_AMBULATORY_CARE_PROVIDER_SITE_OTHER): Payer: Medicaid Other | Admitting: Obstetrics

## 2022-06-27 VITALS — BP 122/79 | HR 92 | Ht 62.5 in | Wt 176.2 lb

## 2022-06-27 DIAGNOSIS — Z30013 Encounter for initial prescription of injectable contraceptive: Secondary | ICD-10-CM

## 2022-06-27 DIAGNOSIS — Z01419 Encounter for gynecological examination (general) (routine) without abnormal findings: Secondary | ICD-10-CM | POA: Insufficient documentation

## 2022-06-27 DIAGNOSIS — Z3009 Encounter for other general counseling and advice on contraception: Secondary | ICD-10-CM

## 2022-06-27 DIAGNOSIS — Z3202 Encounter for pregnancy test, result negative: Secondary | ICD-10-CM

## 2022-06-27 DIAGNOSIS — Z3041 Encounter for surveillance of contraceptive pills: Secondary | ICD-10-CM

## 2022-06-27 DIAGNOSIS — N898 Other specified noninflammatory disorders of vagina: Secondary | ICD-10-CM | POA: Diagnosis present

## 2022-06-27 DIAGNOSIS — E538 Deficiency of other specified B group vitamins: Secondary | ICD-10-CM

## 2022-06-27 DIAGNOSIS — E66811 Obesity, class 1: Secondary | ICD-10-CM

## 2022-06-27 DIAGNOSIS — E669 Obesity, unspecified: Secondary | ICD-10-CM

## 2022-06-27 DIAGNOSIS — Z113 Encounter for screening for infections with a predominantly sexual mode of transmission: Secondary | ICD-10-CM

## 2022-06-27 LAB — POCT URINE PREGNANCY: Preg Test, Ur: NEGATIVE

## 2022-06-27 MED ORDER — MEDROXYPROGESTERONE ACETATE 150 MG/ML IM SUSP: 150.0000 mg | INTRAMUSCULAR | 4 refills | Status: DC

## 2022-06-27 MED ORDER — MEDROXYPROGESTERONE ACETATE 150 MG/ML IM SUSP
150.0000 mg | Freq: Once | INTRAMUSCULAR | Status: AC
  Administered 2022-06-27: 150 mg via INTRAMUSCULAR

## 2022-06-27 NOTE — Progress Notes (Signed)
Patient given depo injection In LUOQ. Patient tolerated well. Next Depo due 4/25/-5/9. Office supply given

## 2022-06-27 NOTE — Progress Notes (Signed)
Subjective:        Tamara Waller is a 42 y.o. female here for a routine exam.  Current complaints: Vaginal discharge .    Personal health questionnaire:  Is patient Ashkenazi Jewish, have a family history of breast and/or ovarian cancer: no Is there a family history of uterine cancer diagnosed at age < 58, gastrointestinal cancer, urinary tract cancer, family member who is a Field seismologist syndrome-associated carrier: no Is the patient overweight and hypertensive, family history of diabetes, personal history of gestational diabetes, preeclampsia or PCOS: no Is patient over 18, have PCOS,  family history of premature CHD under age 73, diabetes, smoke, have hypertension or peripheral artery disease:  no At any time, has a partner hit, kicked or otherwise hurt or frightened you?: no Over the past 2 weeks, have you felt down, depressed or hopeless?: no Over the past 2 weeks, have you felt little interest or pleasure in doing things?:no   Gynecologic History Patient's last menstrual period was 06/08/2022. Contraception: OCP (estrogen/progesterone) Last Pap: 2021. Results were: normal Last mammogram: n/a. Results were: n/a  Obstetric History OB History  Gravida Para Term Preterm AB Living  '4 3 2 1 1 3  '$ SAB IAB Ectopic Multiple Live Births  0 0 1 0 3    # Outcome Date GA Lbr Len/2nd Weight Sex Delivery Anes PTL Lv  4 Ectopic 01/2022          3 Preterm  [redacted]w[redacted]d  M Vag-Spont   LIV  2 Term      Vag-Spont   LIV  1 Term      Vag-Spont   LIV    Past Medical History:  Diagnosis Date   Asthma    Chlamydia    GERD (gastroesophageal reflux disease)    Ovarian cyst    Pyelonephritis    Trichomonas    UTI (urinary tract infection)     Past Surgical History:  Procedure Laterality Date   CARDIAC SURGERY     as newborn   HERNIA REPAIR     LAPAROSCOPIC UNILATERAL SALPINGECTOMY Left 02/06/2022   Procedure: LAPAROSCOPIC LEFT SALPINGECTOMY WITH REMOVAL OF ECTOPIC PREGNANCY;  Surgeon:  CMora Bellman MD;  [facility]  Service: Gynecology;  Laterality: Left;     Current Outpatient Medications:    cyanocobalamin (VITAMIN B12) 1000 MCG tablet, Take 1,000 mcg by mouth daily., Disp: , Rfl:    medroxyPROGESTERone (DEPO-PROVERA) 150 MG/ML injection, Inject 1 mL (150 mg total) into the muscle every 3 (three) months., Disp: 1 mL, Rfl: 4   VITAMIN D PO, Take by mouth., Disp: , Rfl:    zinc gluconate 50 MG tablet, Take by mouth., Disp: , Rfl:    cyclobenzaprine (FLEXERIL) 10 MG tablet, Take 1 tablet by mouth 3 times daily as needed for muscle spasm. Warning: May cause drowsiness., Disp: 21 tablet, Rfl: 0   ibuprofen (ADVIL) 600 MG tablet, Take 1 tablet (600 mg total) by mouth every 6 (six) hours as needed. (Patient not taking: Reported on 06/27/2022), Disp: 60 tablet, Rfl: 3   Lidocaine (HM LIDOCAINE PATCH) 4 % PTCH, Apply 1 patch topically in the morning and at bedtime., Disp: 6 patch, Rfl: 0   Multiple Vitamin (MULTIVITAMIN) tablet, Take 1 tablet by mouth daily., Disp: , Rfl:   Current Facility-Administered Medications:    medroxyPROGESTERone (DEPO-PROVERA) injection 150 mg, 150 mg, Intramuscular, Once, HShelly Bombard MD No Known Allergies  Social History   Tobacco Use   Smoking status: Never  Smokeless tobacco: Never  Substance Use Topics   Alcohol use: No    Alcohol/week: 0.0 standard drinks of alcohol    Family History  Problem Relation Age of Onset   Schizophrenia Mother       Review of Systems  Constitutional: negative for fatigue and weight loss Respiratory: negative for cough and wheezing Cardiovascular: negative for chest pain, fatigue and palpitations Gastrointestinal: negative for abdominal pain and change in bowel habits Musculoskeletal:negative for myalgias Neurological: negative for gait problems and tremors Behavioral/Psych: negative for abusive relationship, depression Endocrine: negative for temperature intolerance    Genitourinary:  positive for vaginal discharge.  negative for abnormal menstrual periods, genital lesions, hot flashes, sexual problems  Integument/breast: negative for breast lump, breast tenderness, nipple discharge and skin lesion(s)    Objective:       BP 122/79   Pulse 92   Ht 5' 2.5" (1.588 m)   Wt 176 lb 3.2 oz (79.9 kg)   LMP 06/08/2022   BMI 31.71 kg/m  General:   Alert and no distresas  Skin:   no rash or abnormalities  Lungs:   clear to auscultation bilaterally  Heart:   regular rate and rhythm, S1, S2 normal, no murmur, click, rub or gallop  Breasts:   normal without suspicious masses, skin or nipple changes or axillary nodes  Abdomen:  normal findings: no organomegaly, soft, non-tender and no hernia  Pelvis:  External genitalia: normal general appearance Urinary system: urethral meatus normal and bladder without fullness, nontender Vaginal: normal without tenderness, induration or masses Cervix: normal appearance Adnexa: normal bimanual exam Uterus: anteverted and non-tender, normal size   Lab Review Urine pregnancy test Labs reviewed yes Radiologic studies reviewed yes  I have spent a total of 20 minutes of face-to-face time, excluding clinical staff time, reviewing notes and preparing to see patient, ordering tests and/or medications, and counseling the patient.   Assessment:    1. Encounter for gynecological examination with Papanicolaou smear of cervix Rx: - Cytology - PAP( [place])  2. Vaginal discharge Rx: - Cervicovaginal ancillary only( [place])  3. Screen for STD (sexually transmitted disease) Rx: - Hepatitis B surface antigen - HIV Antibody (routine testing w rflx) - Hepatitis C antibody - RPR  4. Encounter for surveillance of contraceptive pills - wants to discontinue OCP's - POCT urine pregnancy  5. Obesity (BMI 30.0-34.9)  6. Encounter for other general counseling and advice on contraception - opt ions discussed.  Wants Depo Provera  7.  Encounter for initial prescription of injectable contraceptive Rx: - medroxyPROGESTERone (DEPO-PROVERA) injection 150 mg - medroxyPROGESTERone (DEPO-PROVERA) 150 MG/ML injection; Inject 1 mL (150 mg total) into the muscle every 3 (three) months.  Dispense: 1 mL; Refill: 4  8. B12 deficiency Rx: - Vitamin B12     Plan:    Education reviewed: calcium supplements, depression evaluation, low fat, low cholesterol diet, safe sex/STD prevention, self breast exams, and weight bearing exercise. Contraception: Depo-Provera injections. Mammogram ordered. Follow up in: 1 year.   Meds ordered this encounter  Medications   medroxyPROGESTERone (DEPO-PROVERA) injection 150 mg   medroxyPROGESTERone (DEPO-PROVERA) 150 MG/ML injection    Sig: Inject 1 mL (150 mg total) into the muscle every 3 (three) months.    Dispense:  1 mL    Refill:  4   Orders Placed This Encounter  Procedures   Hepatitis B surface antigen   HIV Antibody (routine testing w rflx)   Hepatitis C antibody   RPR   Vitamin  B12   POCT urine pregnancy      [name], MD 06/27/2022 3:38 PM

## 2022-06-27 NOTE — Progress Notes (Signed)
Patient presents for AEX.  Last Pap: 08/27/19 Normal Last MM: Working on getting appt rescheduled.

## 2022-06-28 LAB — HEPATITIS B SURFACE ANTIGEN: Hepatitis B Surface Ag: NEGATIVE

## 2022-06-28 LAB — VITAMIN B12: Vitamin B-12: 626 pg/mL (ref 232–1245)

## 2022-06-28 LAB — RPR: RPR Ser Ql: NONREACTIVE

## 2022-06-28 LAB — CERVICOVAGINAL ANCILLARY ONLY
Bacterial Vaginitis (gardnerella): POSITIVE — AB
Candida Glabrata: NEGATIVE
Candida Vaginitis: POSITIVE — AB
Chlamydia: NEGATIVE
Comment: NEGATIVE
Comment: NEGATIVE
Comment: NEGATIVE
Comment: NEGATIVE
Comment: NEGATIVE
Comment: NORMAL
Neisseria Gonorrhea: NEGATIVE
Trichomonas: NEGATIVE

## 2022-06-28 LAB — HIV ANTIBODY (ROUTINE TESTING W REFLEX): HIV Screen 4th Generation wRfx: NONREACTIVE

## 2022-06-28 LAB — HEPATITIS C ANTIBODY: Hep C Virus Ab: NONREACTIVE

## 2022-07-04 ENCOUNTER — Other Ambulatory Visit: Payer: Self-pay | Admitting: Obstetrics

## 2022-07-04 DIAGNOSIS — B9689 Other specified bacterial agents as the cause of diseases classified elsewhere: Secondary | ICD-10-CM

## 2022-07-04 DIAGNOSIS — B379 Candidiasis, unspecified: Secondary | ICD-10-CM

## 2022-07-04 LAB — CYTOLOGY - PAP
Comment: NEGATIVE
Diagnosis: NEGATIVE
Diagnosis: REACTIVE
High risk HPV: NEGATIVE

## 2022-07-04 MED ORDER — FLUCONAZOLE 150 MG PO TABS: 150.0000 mg | ORAL_TABLET | Freq: Once | ORAL | 0 refills | Status: AC

## 2022-07-04 MED ORDER — METRONIDAZOLE 500 MG PO TABS: 500.0000 mg | ORAL_TABLET | Freq: Two times a day (BID) | ORAL | 2 refills | Status: DC

## 2022-07-16 ENCOUNTER — Ambulatory Visit: Payer: Medicaid Other | Admitting: Medical

## 2022-09-18 ENCOUNTER — Ambulatory Visit (INDEPENDENT_AMBULATORY_CARE_PROVIDER_SITE_OTHER): Payer: Medicaid Other

## 2022-09-18 VITALS — BP 118/80 | HR 88 | Wt 183.8 lb

## 2022-09-18 DIAGNOSIS — Z3042 Encounter for surveillance of injectable contraceptive: Secondary | ICD-10-CM

## 2022-09-18 MED ORDER — MEDROXYPROGESTERONE ACETATE 150 MG/ML IM SUSP
150.0000 mg | Freq: Once | INTRAMUSCULAR | Status: AC
  Administered 2022-09-18: 150 mg via INTRAMUSCULAR

## 2022-09-18 NOTE — Progress Notes (Signed)
Subjective:  Pt in for Depo Provera injection.    Objective: Need for contraception. No unusual complaints.    Assessment: Depo given R upper outer quadrant. Pt tolerated Depo injection.   Plan:  Next injection due July 16-30.

## 2022-10-18 ENCOUNTER — Other Ambulatory Visit: Payer: Self-pay | Admitting: Obstetrics

## 2022-10-18 ENCOUNTER — Ambulatory Visit
Admission: RE | Admit: 2022-10-18 | Discharge: 2022-10-18 | Disposition: A | Discharge status: Home / Self Care | Payer: Medicaid Other | Source: Ambulatory Visit | Attending: Obstetrics and Gynecology | Admitting: Obstetrics and Gynecology

## 2022-10-18 DIAGNOSIS — Z01818 Encounter for other preprocedural examination: Secondary | ICD-10-CM

## 2022-10-18 DIAGNOSIS — O008 Other ectopic pregnancy without intrauterine pregnancy: Secondary | ICD-10-CM

## 2022-10-18 DIAGNOSIS — Z9889 Other specified postprocedural states: Secondary | ICD-10-CM

## 2022-10-18 DIAGNOSIS — Z1231 Encounter for screening mammogram for malignant neoplasm of breast: Secondary | ICD-10-CM

## 2022-12-04 ENCOUNTER — Ambulatory Visit: Payer: 59

## 2023-01-19 ENCOUNTER — Emergency Department
Admission: EM | Admit: 2023-01-19 | Discharge: 2023-01-19 | Disposition: A | Discharge status: Home / Self Care | Payer: 59 | Attending: Emergency Medicine | Admitting: Emergency Medicine

## 2023-01-19 ENCOUNTER — Other Ambulatory Visit: Payer: Self-pay

## 2023-01-19 DIAGNOSIS — K0889 Other specified disorders of teeth and supporting structures: Secondary | ICD-10-CM | POA: Diagnosis not present

## 2023-01-19 MED ORDER — KETOROLAC TROMETHAMINE 15 MG/ML IJ SOLN
15.0000 mg | Freq: Once | INTRAMUSCULAR | Status: AC
  Administered 2023-01-19: 15 mg via INTRAMUSCULAR
  Filled 2023-01-19: qty 1

## 2023-01-19 MED ORDER — AMOXICILLIN-POT CLAVULANATE 875-125 MG PO TABS: 1.0000 | ORAL_TABLET | Freq: Two times a day (BID) | ORAL | 0 refills | Status: AC

## 2023-01-19 MED ORDER — AMOXICILLIN-POT CLAVULANATE 875-125 MG PO TABS
1.0000 | ORAL_TABLET | Freq: Once | ORAL | Status: AC
  Administered 2023-01-19: 1 via ORAL
  Filled 2023-01-19: qty 1

## 2023-01-19 NOTE — ED Triage Notes (Signed)
Pt states she bit something and chipped her right upper tooth and c/o throbbing pain that started a few days ago. Pt was seen by dentist for chipped tooth but hasn't been able to go back yet.

## 2023-01-19 NOTE — ED Provider Notes (Signed)
[facility] Provider Note  Patient Contact: 10:03 PM (approximate)   History   Dental Pain   HPI  Tamara Waller is a 42 y.o. female presents to the emergency department with a broken superior four.  Patient states that she has already been evaluated by a local dentist and was supposed to have tooth extracted but states that she canceled appointment to attend her graduation.  Patient reports that she is having right upper jaw pain and is concerned that she might need to be started on some antibiotics.  No pain underneath the tongue or difficulty swallowing.      Physical Exam   Triage Vital Signs: ED Triage Vitals  Encounter Vitals Group     BP 01/19/23 1835 133/85     Systolic BP Percentile --      Diastolic BP Percentile --      Pulse Rate 01/19/23 1835 (!) 105     Resp 01/19/23 1835 17     Temp 01/19/23 1835 98.7 F (37.1 C)     Temp Source 01/19/23 1835 Oral     SpO2 01/19/23 1835 98 %     Weight --      Height --      Head Circumference --      Peak Flow --      Pain Score 01/19/23 1836 6     Pain Loc --      Pain Education --      Exclude from Growth Chart --     Most recent vital signs: Vitals:   01/19/23 1835  BP: 133/85  Pulse: (!) 105  Resp: 17  Temp: 98.7 F (37.1 C)  SpO2: 98%     General: Alert and in no acute distress. Eyes:  PERRL. EOMI. Head: No acute traumatic findings ENT:      Nose: No congestion/rhinnorhea.      Mouth/Throat: Mucous membranes are moist.  Has broken superior 4. Neck: No stridor. No cervical spine tenderness to palpation. Cardiovascular:  Good peripheral perfusion Respiratory: Normal respiratory effort without tachypnea or retractions. Lungs CTAB.Good air entry to the bases with no decreased or absent breath sounds. Gastrointestinal: Bowel sounds 4 quadrants. Soft and nontender to palpation. No guarding or rigidity. No palpable masses. No distention. No CVA tenderness. Musculoskeletal: Full  range of motion to all extremities.  Neurologic:  No gross focal neurologic deficits are appreciated.  Skin:   No rash noted    ED Results / Procedures / Treatments   Labs (all labs ordered are listed, but only abnormal results are displayed) Labs Reviewed - No data to display      PROCEDURES:  Critical Care performed: No  Procedures   MEDICATIONS ORDERED IN ED: Medications  ketorolac (TORADOL) 15 MG/ML injection 15 mg (has no administration in time range)  amoxicillin-clavulanate (AUGMENTIN) 875-125 MG per tablet 1 tablet (has no administration in time range)     IMPRESSION / MDM / ASSESSMENT AND PLAN / ED COURSE  I reviewed the triage vital signs and the nursing notes.                              Assessment and plan Dental pain 42 year old female presents to the emergency department with dental pain.  Patient has a broken superior four.  Patient was given injection of Toradol in the emergency department for pain and was discharged with Augmentin.  Return precautions were given to  return with new or worsening symptoms.  All patient questions were answered.      FINAL CLINICAL IMPRESSION(S) / ED DIAGNOSES   Final diagnoses:  Pain, dental     Rx / DC Orders   ED Discharge Orders     None        Note:  This document was prepared using Dragon voice recognition software and may include unintentional dictation errors.   Pia Mau [place], Cordelia Poche 01/19/23 2206    Sharman Cheek, MD 01/22/23 1534

## 2023-01-19 NOTE — Discharge Instructions (Signed)
Take Augmentin twice daily for ten days.  

## 2023-02-01 ENCOUNTER — Telehealth: Payer: Self-pay

## 2023-02-01 NOTE — Telephone Encounter (Signed)
Transition Care Management Unsuccessful Follow-up Telephone Call  Date of discharge and from where:  [place] 8/31  Attempts:  1st Attempt  Reason for unsuccessful TCM follow-up call:  No answer/busy   Lenard Forth [place]  [facility], [facility] Guide, Phone: [phone] Website: [email or web address]

## 2023-02-04 ENCOUNTER — Telehealth: Payer: Self-pay

## 2023-02-04 NOTE — Telephone Encounter (Signed)
Transition Care Management Unsuccessful Follow-up Telephone Call  Date of discharge and from where:  [place] 8/31  Attempts:  2nd Attempt  Reason for unsuccessful TCM follow-up call:  No answer/busy   Lenard Forth [place]  [facility], [facility] Guide, Phone: [phone] Website: [email or web address]

## 2023-07-08 ENCOUNTER — Emergency Department
Admission: EM | Admit: 2023-07-08 | Discharge: 2023-07-08 | Discharge status: Against Medical Advice | Payer: 59 | Attending: Emergency Medicine | Admitting: Emergency Medicine

## 2023-07-08 ENCOUNTER — Emergency Department: Payer: 59

## 2023-07-08 ENCOUNTER — Other Ambulatory Visit: Payer: Self-pay

## 2023-07-08 DIAGNOSIS — R059 Cough, unspecified: Secondary | ICD-10-CM | POA: Insufficient documentation

## 2023-07-08 DIAGNOSIS — R079 Chest pain, unspecified: Secondary | ICD-10-CM | POA: Insufficient documentation

## 2023-07-08 DIAGNOSIS — Z5321 Procedure and treatment not carried out due to patient leaving prior to being seen by health care provider: Secondary | ICD-10-CM | POA: Insufficient documentation

## 2023-07-08 LAB — CBC
HCT: 42.3 % (ref 36.0–46.0)
Hemoglobin: 14.3 g/dL (ref 12.0–15.0)
MCH: 29.2 pg (ref 26.0–34.0)
MCHC: 33.8 g/dL (ref 30.0–36.0)
MCV: 86.3 fL (ref 80.0–100.0)
Platelets: 538 10*3/uL — ABNORMAL HIGH (ref 150–400)
RBC: 4.9 MIL/uL (ref 3.87–5.11)
RDW: 12.6 % (ref 11.5–15.5)
WBC: 8.1 10*3/uL (ref 4.0–10.5)
nRBC: 0 % (ref 0.0–0.2)

## 2023-07-08 LAB — BASIC METABOLIC PANEL
Anion gap: 11 (ref 5–15)
BUN: 10 mg/dL (ref 6–20)
CO2: 24 mmol/L (ref 22–32)
Calcium: 9.3 mg/dL (ref 8.9–10.3)
Chloride: 101 mmol/L (ref 98–111)
Creatinine, Ser: 0.67 mg/dL (ref 0.44–1.00)
GFR, Estimated: >60 mL/min (ref >60)
Glucose, Bld: 130 mg/dL — ABNORMAL HIGH (ref 70–99)
Potassium: 3.7 mmol/L (ref 3.5–5.1)
Sodium: 136 mmol/L (ref 135–145)

## 2023-07-08 LAB — TROPONIN I (HIGH SENSITIVITY): Troponin I (High Sensitivity): 3 ng/L (ref <18)

## 2023-07-08 NOTE — ED Triage Notes (Signed)
Pt reports sharp chest pain that is worse with coughing and moving her arms, pt reports it is intermittent for the past 2 days. Pt denies shortness of breath cough congestion or fever.

## 2024-02-17 ENCOUNTER — Ambulatory Visit (INDEPENDENT_AMBULATORY_CARE_PROVIDER_SITE_OTHER)

## 2024-02-17 VITALS — BP 105/78 | HR 91 | Resp 16 | Ht 62.0 in | Wt 194.0 lb

## 2024-02-17 DIAGNOSIS — Z6834 Body mass index (BMI) 34.0-34.9, adult: Secondary | ICD-10-CM | POA: Insufficient documentation

## 2024-02-17 DIAGNOSIS — Z6835 Body mass index (BMI) 35.0-35.9, adult: Secondary | ICD-10-CM | POA: Diagnosis not present

## 2024-02-17 DIAGNOSIS — Z131 Encounter for screening for diabetes mellitus: Secondary | ICD-10-CM

## 2024-02-17 DIAGNOSIS — J4520 Mild intermittent asthma, uncomplicated: Secondary | ICD-10-CM

## 2024-02-17 DIAGNOSIS — K5900 Constipation, unspecified: Secondary | ICD-10-CM | POA: Diagnosis not present

## 2024-02-17 DIAGNOSIS — Z1211 Encounter for screening for malignant neoplasm of colon: Secondary | ICD-10-CM

## 2024-02-17 DIAGNOSIS — Z136 Encounter for screening for cardiovascular disorders: Secondary | ICD-10-CM

## 2024-02-17 DIAGNOSIS — R5383 Other fatigue: Secondary | ICD-10-CM

## 2024-02-17 DIAGNOSIS — Z1231 Encounter for screening mammogram for malignant neoplasm of breast: Secondary | ICD-10-CM

## 2024-02-17 MED ORDER — FLUTICASONE PROPIONATE 50 MCG/ACT NA SUSP: 2.0000 | Freq: Every day | NASAL | 3 refills | Status: AC

## 2024-02-17 MED ORDER — ALBUTEROL SULFATE HFA 108 (90 BASE) MCG/ACT IN AERS: 1.0000 | INHALATION_SPRAY | RESPIRATORY_TRACT | 3 refills | Status: AC | PRN

## 2024-02-17 MED ORDER — POLYETHYLENE GLYCOL 3350 17 GM/SCOOP PO POWD: 17.0000 g | Freq: Three times a day (TID) | ORAL | 0 refills | Status: AC | PRN

## 2024-02-17 NOTE — Assessment & Plan Note (Signed)
 Discussed eating a balanced diet and incorporating movement into daily routine. May be a candidate for weight loss medication. Will follow up in 1 month.

## 2024-02-17 NOTE — Assessment & Plan Note (Signed)
 Chronic, controlled. Will refill albuterol  inhaler and Flonase .

## 2024-02-17 NOTE — Assessment & Plan Note (Signed)
 Chronic, uncontrolled. Patient states that she can go weeks at a time without a BM. Exam notable for abdominal distension without tenderness.  - Recommend taking Miralax (1 capful in 8oz of water) up to three times daily to target 1-2 soft bowel movements daily. - Recommend high fiber diet, information given in AVS - Follow up in 1 month

## 2024-02-17 NOTE — Patient Instructions (Signed)
 I recommend taking Miralax (1 capful in 8oz of water) up to three times daily to target 1-2 soft bowel movements.  I also recommend increasing your fluid intake and fiber in your diet. Good sources of fiber include vegetables, fruits, beans, nuts, and whole grains.

## 2024-02-17 NOTE — Assessment & Plan Note (Signed)
 Chronic, uncontrolled. Previously thought to have OSA, however sleep study done a few years ago was normal per patient. DDx also includes thyroid dysfunction, anemia, low vit B12, vit D, iron, kidney/liver disease. Was previously taking B12 and D supplementation. - Will obtain labs as below - Will request sleep study records - Follow up in 1 month

## 2024-02-17 NOTE — Progress Notes (Signed)
 New patient visit   Patient: Tamara Waller   DOB: April 24, 1981   43 y.o. Female  MRN: [id number] Visit Date: 02/17/2024  Today's healthcare provider: Isaiah DELENA Pepper, MD   Chief Complaint  Patient presents with   New Patient (Initial Visit)    NP/ Est care/ Wt loss/labs/stomach concerns.   Subjective    Tamara Waller is a 43 y.o. female who presents today as a new patient to establish care.   HPI:  Fatigue- always feels tired during the day. Thought she had sleep apnea but had sleep study done and was told that she did not have OSA.  Weight- has been trying to optimize diet and walking more outside  Constipation- has 1 BM every 2 weeks. Has never tried taking a laxative. Eats bread, meats.  Asthma- has albuterol  inhaler PRN, rarely needs. Takes Flonase  but ran out.  Past Medical History:  Diagnosis Date   Asthma    Chlamydia    GERD (gastroesophageal reflux disease)    Ovarian cyst    Pyelonephritis    Trichomonas    UTI (urinary tract infection)    Past Surgical History:  Procedure Laterality Date   CARDIAC SURGERY     as newborn   HERNIA REPAIR     LAPAROSCOPIC UNILATERAL SALPINGECTOMY Left 02/06/2022   Procedure: LAPAROSCOPIC LEFT SALPINGECTOMY WITH REMOVAL OF ECTOPIC PREGNANCY;  Surgeon: Alger Gong, MD;  Location: [facility];  Service: Gynecology;  Laterality: Left;   Family Status  Relation Name Status   Mother  Deceased   Father  Deceased   MGM  Deceased   MGF  Deceased   PGM  Deceased   PGF  Deceased   Neg Hx  (Not Specified)  No partnership data on file   Family History  Problem Relation Age of Onset   Schizophrenia Mother    Breast cancer Neg Hx    Social History   Socioeconomic History   Marital status: Single    Spouse name: Not on file   Number of children: Not on file   Years of education: Not on file   Highest education level: Not on file  Occupational History   Not on file  Tobacco Use   Smoking status: Never    Passive  exposure: Never   Smokeless tobacco: Never  Vaping Use   Vaping status: Never Used  Substance and Sexual Activity   Alcohol use: No    Alcohol/week: 0.0 standard drinks of alcohol   Drug use: No   Sexual activity: Yes    Partners: Male    Birth control/protection: Injection  Other Topics Concern   Not on file  Social History Narrative   Not on file   Social Drivers of Health   Financial Resource Strain: Not on file  Food Insecurity: No Food Insecurity (07/13/2021)   Received from [facility]   Hunger Vital Sign    Within the past 12 months, you worried that your food would run out before you got the money to buy more.: Never true    Within the past 12 months, the food you bought just didn't last and you didn't have money to get more.: Never true  Transportation Needs: Not on file  Physical Activity: Not on file  Stress: Not on file  Social Connections: Unknown (09/17/2021)   Received from [facility]   Social Network    Social Network: Not on file   Outpatient Medications Prior to Visit  Medication Sig  cyanocobalamin (VITAMIN B12) 1000 MCG tablet Take 1,000 mcg by mouth daily.   medroxyPROGESTERone  (DEPO-PROVERA ) 150 MG/ML injection Inject 1 mL (150 mg total) into the muscle every 3 (three) months.   Multiple Vitamin (MULTIVITAMIN) tablet Take 1 tablet by mouth daily.   zinc gluconate 50 MG tablet Take by mouth.   [DISCONTINUED] ibuprofen  (ADVIL ) 600 MG tablet Take 1 tablet (600 mg total) by mouth every 6 (six) hours as needed.   [DISCONTINUED] metroNIDAZOLE  (FLAGYL ) 500 MG tablet Take 1 tablet (500 mg total) by mouth 2 (two) times daily.   VITAMIN D PO Take by mouth. (Patient not taking: Reported on 02/17/2024)   [DISCONTINUED] cyclobenzaprine  (FLEXERIL ) 10 MG tablet Take 1 tablet by mouth 3 times daily as needed for muscle spasm. Warning: May cause drowsiness. (Patient not taking: Reported on 02/17/2024)   [DISCONTINUED] Lidocaine  (HM LIDOCAINE  PATCH) 4 % PTCH Apply 1  patch topically in the morning and at bedtime. (Patient not taking: Reported on 02/17/2024)   No facility-administered medications prior to visit.   No Known Allergies  Reviews of Systems as noted in HPI.      Objective    BP 105/78 (BP Location: Right Arm, Patient Position: Sitting, Cuff Size: Normal)   Pulse 91   Resp 16   Ht 5' 2 (1.575 m)   Wt 194 lb (88 kg)   SpO2 100%   BMI 35.48 kg/m     Physical Exam Constitutional:      Appearance: Normal appearance.  HENT:     Head: Normocephalic and atraumatic.     Mouth/Throat:     Mouth: Mucous membranes are moist.  Eyes:     Pupils: Pupils are equal, round, and reactive to light.  Pulmonary:     Effort: Pulmonary effort is normal.  Abdominal:     General: Abdomen is flat. There is distension.     Palpations: Abdomen is soft.  Skin:    General: Skin is warm.  Neurological:     General: No focal deficit present.     Mental Status: She is alert.     Depression Screen    06/27/2022    3:22 PM  PHQ 2/9 Scores  PHQ - 2 Score 0  PHQ- 9 Score 0   No results found for any visits on 02/17/24.  Assessment & Plan      Problem List Items Addressed This Visit       Respiratory   Mild intermittent asthma without complication   Chronic, controlled. Will refill albuterol  inhaler and Flonase .      Relevant Medications   albuterol  (VENTOLIN  HFA) 108 (90 Base) MCG/ACT inhaler   fluticasone  (FLONASE ) 50 MCG/ACT nasal spray     Other   Constipation   Chronic, uncontrolled. Patient states that she can go weeks at a time without a BM. Exam notable for abdominal distension without tenderness.  - Recommend taking Miralax (1 capful in 8oz of water) up to three times daily to target 1-2 soft bowel movements daily. - Recommend high fiber diet, information given in AVS - Follow up in 1 month      Relevant Medications   polyethylene glycol powder (MIRALAX) 17 GM/SCOOP powder   Screening for colon cancer   Relevant Orders    Ambulatory referral to Gastroenterology   Other fatigue - Primary   Chronic, uncontrolled. Previously thought to have OSA, however sleep study done a few years ago was normal per patient. DDx also includes thyroid dysfunction, anemia, low vit B12, vit  D, iron, kidney/liver disease. Was previously taking B12 and D supplementation. - Will obtain labs as below - Will request sleep study records - Follow up in 1 month      Relevant Orders   VITAMIN D 25 Hydroxy (Vit-D Deficiency, Fractures)   Vitamin B12   Iron, TIBC and Ferritin Panel   CBC   TSH   Comprehensive metabolic panel with GFR   BMI 64.9-64.0,jilou   Discussed eating a balanced diet and incorporating movement into daily routine. May be a candidate for weight loss medication. Will follow up in 1 month.      Other Visit Diagnoses       Screening for diabetes mellitus (DM)       Relevant Orders   Hemoglobin A1c     Screening for cardiovascular condition       Relevant Orders   Lipid panel     Screening mammogram for breast cancer       Relevant Orders   MM 3D SCREENING MAMMOGRAM BILATERAL BREAST         Return in about 4 weeks (around 03/16/2024) for Follow Up.      Isaiah DELENA Pepper, MD  [facility] [phone] (phone) [phone] (fax)

## 2024-02-18 ENCOUNTER — Ambulatory Visit: Payer: Self-pay

## 2024-02-18 LAB — COMPREHENSIVE METABOLIC PANEL WITH GFR
ALT: 15 [IU]/L (ref 0–32)
AST: 17 [IU]/L (ref 0–40)
Albumin: 4.2 g/dL (ref 3.9–4.9)
Alkaline Phosphatase: 93 [IU]/L (ref 41–116)
BUN/Creatinine Ratio: 13 (ref 9–23)
BUN: 9 mg/dL (ref 6–24)
Bilirubin Total: <0.2 mg/dL (ref 0.0–1.2)
CO2: 22 mmol/L (ref 20–29)
Calcium: 9.7 mg/dL (ref 8.7–10.2)
Chloride: 103 mmol/L (ref 96–106)
Creatinine, Ser: 0.72 mg/dL (ref 0.57–1.00)
Globulin, Total: 3 g/dL (ref 1.5–4.5)
Glucose: 111 mg/dL — ABNORMAL HIGH (ref 70–99)
Potassium: 4.2 mmol/L (ref 3.5–5.2)
Sodium: 139 mmol/L (ref 134–144)
Total Protein: 7.2 g/dL (ref 6.0–8.5)
eGFR: 105 mL/min/{1.73_m2}

## 2024-02-18 LAB — VITAMIN D 25 HYDROXY (VIT D DEFICIENCY, FRACTURES): Vit D, 25-Hydroxy: 28.7 ng/mL — ABNORMAL LOW (ref 30.0–100.0)

## 2024-02-18 LAB — CBC
Hematocrit: 41.2 % (ref 34.0–46.6)
Hemoglobin: 13.8 g/dL (ref 11.1–15.9)
MCH: 29.6 pg (ref 26.6–33.0)
MCHC: 33.5 g/dL (ref 31.5–35.7)
MCV: 88 fL (ref 79–97)
Platelets: 510 x10E3/uL — ABNORMAL HIGH (ref 150–450)
RBC: 4.66 x10E6/uL (ref 3.77–5.28)
RDW: 12.6 % (ref 11.7–15.4)
WBC: 7.3 x10E3/uL (ref 3.4–10.8)

## 2024-02-18 LAB — HEMOGLOBIN A1C
Est. average glucose Bld gHb Est-mCnc: 131 mg/dL
Hgb A1c MFr Bld: 6.2 % — ABNORMAL HIGH (ref 4.8–5.6)

## 2024-02-18 LAB — IRON,TIBC AND FERRITIN PANEL
Ferritin: 57 ng/mL (ref 15–150)
Iron Saturation: 22 % (ref 15–55)
Iron: 69 ug/dL (ref 27–159)
Total Iron Binding Capacity: 312 ug/dL (ref 250–450)
UIBC: 243 ug/dL (ref 131–425)

## 2024-02-18 LAB — LIPID PANEL
Chol/HDL Ratio: 2.7 ratio (ref 0.0–4.4)
Cholesterol, Total: 171 mg/dL (ref 100–199)
HDL: 63 mg/dL (ref >39)
LDL Chol Calc (NIH): 89 mg/dL (ref 0–99)
Triglycerides: 107 mg/dL (ref 0–149)
VLDL Cholesterol Cal: 19 mg/dL (ref 5–40)

## 2024-02-18 LAB — VITAMIN B12: Vitamin B-12: 424 pg/mL (ref 232–1245)

## 2024-02-18 LAB — TSH: TSH: 1.34 u[IU]/mL (ref 0.450–4.500)

## 2024-02-19 ENCOUNTER — Telehealth: Payer: Self-pay

## 2024-02-19 NOTE — Telephone Encounter (Unsigned)
 Copied from CRM [id number]. Topic: Clinical - Medical Advice >> Feb 19, 2024  1:32 PM Tiffini S wrote: Reason for CRM: Patient have questions about weight loss- asked to talk about her vitals, TSH, Vitamin B12- want to make sure the numbers are not low  Patient asked to have lab results mailed  to her address/ sent link to email address for [facility]

## 2024-02-20 ENCOUNTER — Other Ambulatory Visit: Payer: Self-pay

## 2024-02-20 ENCOUNTER — Telehealth: Payer: Self-pay

## 2024-02-20 DIAGNOSIS — R7303 Prediabetes: Secondary | ICD-10-CM

## 2024-02-20 NOTE — Telephone Encounter (Signed)
 Copied from CRM #[id number]. Topic: Referral - Request for Referral >> Feb 19, 2024  1:27 PM Tiffini S wrote: Did the patient discuss referral with their provider in the last year? No (If No - schedule appointment) (If Yes - send message)  Appointment offered? Yes  Type of order/referral and detailed reason for visit: Nutritionist  Preference of office, provider, location: N?A  If referral order, have you been seen by this specialty before? No (If Yes, this issue or another issue? When? Where?  Can we respond through MyChart? No, please call at [phone]

## 2024-02-20 NOTE — Telephone Encounter (Signed)
 Sent referral for nutritionist

## 2024-02-21 ENCOUNTER — Telehealth: Payer: Self-pay

## 2024-02-21 ENCOUNTER — Other Ambulatory Visit: Payer: Self-pay

## 2024-02-21 DIAGNOSIS — E559 Vitamin D deficiency, unspecified: Secondary | ICD-10-CM

## 2024-02-21 MED ORDER — VITAMIN D (CHOLECALCIFEROL) 25 MCG (1000 UT) PO CAPS
1.0000 | ORAL_CAPSULE | Freq: Every day | ORAL | 3 refills | Status: AC
Start: 2024-02-21 — End: ?

## 2024-02-21 NOTE — Telephone Encounter (Signed)
 Copied from CRM [id number]. Topic: General - Other >> Feb 20, 2024  3:17 PM Antony S wrote: Reason for CRM: pt requesting an injection to help with energy because she has fatigue  She's also interested in [employer]

## 2024-02-24 NOTE — Progress Notes (Signed)
 Unfortunately, Medicaid no longer covers [facility] for pre-diabetes.

## 2024-02-25 ENCOUNTER — Other Ambulatory Visit: Payer: Self-pay

## 2024-02-25 ENCOUNTER — Telehealth: Payer: Self-pay

## 2024-02-25 DIAGNOSIS — Z1211 Encounter for screening for malignant neoplasm of colon: Secondary | ICD-10-CM

## 2024-02-25 MED ORDER — PEG 3350-KCL-NA BICARB-NACL 420 G PO SOLR: 4000.0000 mL | Freq: Once | ORAL | 0 refills | Status: AC

## 2024-02-25 NOTE — Telephone Encounter (Signed)
 Gastroenterology Pre-Procedure Review  Request Date: 05/11/24 Requesting Physician: Dr. Melany  PATIENT REVIEW QUESTIONS: The patient responded to the following health history questions as indicated:    1. Are you having any GI issues? no 2. Do you have a personal history of Polyps? no 3. Do you have a family history of Colon Cancer or Polyps? no 4. Diabetes Mellitus? Pre-diabetic no medication taken for this prior to scheduling procedure 5. Joint replacements in the past 12 months?no 6. Major health problems in the past 3 months?no 7. Any artificial heart valves, MVP, or defibrillator?no    MEDICATIONS & ALLERGIES:    Patient reports the following regarding taking any anticoagulation/antiplatelet therapy:   Plavix, Coumadin, Eliquis, Xarelto, Lovenox, Pradaxa, Brilinta, or Effient? no Aspirin? no  Patient confirms/reports the following medications:  Current Outpatient Medications  Medication Sig Dispense Refill   albuterol  (VENTOLIN  HFA) 108 (90 Base) MCG/ACT inhaler Inhale 1-2 puffs into the lungs every 4 (four) hours as needed for wheezing or shortness of breath. 1 each 3   cyanocobalamin (VITAMIN B12) 1000 MCG tablet Take 1,000 mcg by mouth daily.     fluticasone  (FLONASE ) 50 MCG/ACT nasal spray Place 2 sprays into both nostrils daily. 16 g 3   medroxyPROGESTERone  (DEPO-PROVERA ) 150 MG/ML injection Inject 1 mL (150 mg total) into the muscle every 3 (three) months. 1 mL 4   Multiple Vitamin (MULTIVITAMIN) tablet Take 1 tablet by mouth daily.     polyethylene glycol powder (MIRALAX) 17 GM/SCOOP powder Take 17 g by mouth 3 (three) times daily as needed (constipation). Dissolve 1 capful (17g) in 4-8 ounces of liquid and take by mouth daily. 238 g 0   VITAMIN D PO Take by mouth. (Patient not taking: Reported on 02/17/2024)     Vitamin D, Cholecalciferol, 25 MCG (1000 UT) CAPS Take 1 capsule by mouth daily. 90 capsule 3   zinc gluconate 50 MG tablet Take by mouth.     No current  facility-administered medications for this visit.    Patient confirms/reports the following allergies:  No Known Allergies  No orders of the defined types were placed in this encounter.   AUTHORIZATION INFORMATION Primary Insurance: 1D#: Group #:  Secondary Insurance: 1D#: Group #:  SCHEDULE INFORMATION: Date: 05/11/24 Time: Location: [facility]

## 2024-03-16 ENCOUNTER — Ambulatory Visit

## 2024-03-16 ENCOUNTER — Ambulatory Visit (INDEPENDENT_AMBULATORY_CARE_PROVIDER_SITE_OTHER)

## 2024-03-16 VITALS — BP 119/73 | HR 82 | Resp 16 | Ht 62.0 in | Wt 192.0 lb

## 2024-03-16 DIAGNOSIS — E559 Vitamin D deficiency, unspecified: Secondary | ICD-10-CM | POA: Insufficient documentation

## 2024-03-16 DIAGNOSIS — R7303 Prediabetes: Secondary | ICD-10-CM | POA: Insufficient documentation

## 2024-03-16 DIAGNOSIS — K5901 Slow transit constipation: Secondary | ICD-10-CM

## 2024-03-16 DIAGNOSIS — Z6835 Body mass index (BMI) 35.0-35.9, adult: Secondary | ICD-10-CM | POA: Diagnosis not present

## 2024-03-16 MED ORDER — METFORMIN HCL ER 500 MG PO TB24: 500.0000 mg | ORAL_TABLET | Freq: Every day | ORAL | 3 refills | Status: DC

## 2024-03-16 NOTE — Progress Notes (Signed)
 Established patient visit   Patient: Tamara Waller   DOB: 08/23/1980   43 y.o. Female  MRN: [id number] Visit Date: 03/16/2024  Today's healthcare provider: Isaiah DELENA Pepper, MD   Chief Complaint  Patient presents with   Follow-up    4 wk f/u   Subjective    HPI  Discussed the use of AI scribe software for clinical note transcription with the patient, who gave verbal consent to proceed.  History of Present Illness Tamara Waller is a 43 year old female who presents for follow up  She experiences persistent fatigue. She takes over-the-counter vitamin D supplements, alternating between high potency and regular doses based on her fatigue levels. Additionally, she takes vitamin B12 for energy, which she feels helps her. Previously, she felt very tired when going out and needed to rest at her sister's house, but this has improved somewhat.  She experiences constipation with bowel movements approximately three times a week. Her stools are described as 'balls on top of balls' and harder, requiring concentration to avoid straining. She has not yet tried the prescribed powder form laxative due to concerns about taste and texture.  She is actively trying to lose weight by drinking more water, walking frequently, and reducing her intake of bread and soda. She has lost two pounds recently. She is also taking berberine, a supplement she believes helps with weight management and blood sugar control, although she is unsure of its efficacy.  Her social history includes being active on weekends, cleaning, and moving around. She is supported by her pastor and walks with her son, Tamara Waller. She is cautious about medications and supplements, often forgetting to take them, and uses reminders to help with adherence.   Medications: Outpatient Medications Prior to Visit  Medication Sig   albuterol  (VENTOLIN  HFA) 108 (90 Base) MCG/ACT inhaler Inhale 1-2 puffs into the lungs every 4 (four) hours as  needed for wheezing or shortness of breath.   cyanocobalamin (VITAMIN B12) 1000 MCG tablet Take 1,000 mcg by mouth daily.   fluticasone  (FLONASE ) 50 MCG/ACT nasal spray Place 2 sprays into both nostrils daily.   Multiple Vitamin (MULTIVITAMIN) tablet Take 1 tablet by mouth daily.   polyethylene glycol powder (MIRALAX) 17 GM/SCOOP powder Take 17 g by mouth 3 (three) times daily as needed (constipation). Dissolve 1 capful (17g) in 4-8 ounces of liquid and take by mouth daily.   Vitamin D, Cholecalciferol, 25 MCG (1000 UT) CAPS Take 1 capsule by mouth daily.   zinc gluconate 50 MG tablet Take by mouth.   [DISCONTINUED] medroxyPROGESTERone  (DEPO-PROVERA ) 150 MG/ML injection Inject 1 mL (150 mg total) into the muscle every 3 (three) months.   [DISCONTINUED] VITAMIN D PO Take by mouth. (Patient not taking: Reported on 03/16/2024)   No facility-administered medications prior to visit.    Review of Systems as noted in HPI.      Objective    BP 119/73 (BP Location: Left Arm, Patient Position: Sitting, Cuff Size: Normal)   Pulse 82   Resp 16   Ht 5' 2 (1.575 m)   Wt 192 lb (87.1 kg)   SpO2 100%   BMI 35.12 kg/m    Physical Exam Constitutional:      Appearance: Normal appearance.  HENT:     Head: Normocephalic and atraumatic.     Mouth/Throat:     Mouth: Mucous membranes are moist.  Eyes:     Pupils: Pupils are equal, round, and reactive to light.  Pulmonary:     Effort: Pulmonary effort is normal.  Skin:    General: Skin is warm.  Neurological:     General: No focal deficit present.     Mental Status: She is alert.      No results found for any visits on 03/16/24.  Assessment & Plan     Problem List Items Addressed This Visit       Other   Constipation   BMI 35.0-35.9,adult   Prediabetes - Primary   Relevant Medications   metFORMIN (GLUCOPHAGE-XR) 500 MG 24 hr tablet   Vitamin D deficiency    Assessment and Plan Assessment & Plan Prediabetes Prediabetes with  lifestyle modifications underway. Metformin discussed for blood sugar reduction and weight loss. Emphasized preventing progression to diabetes. - Start metformin 500 mg once daily with breakfast. - Follow up in one month to assess response to metformin.  Overweight BMI 35. Overweight with recent weight loss efforts through increased physical activity and dietary changes. Metformin discussed for weight loss and blood sugar effects. Bariatric surgery considered if weight increases. - Continue lifestyle modifications including increased physical activity and dietary changes. - Start metformin 500 mg once daily with breakfast.  Constipation Chronic constipation with infrequent bowel movements and hard stools. Miralax discussed to soften stools and ease bowel movements. - Start Miralax as needed to soften stools.  Vitamin D deficiency Vitamin D deficiency managed with over-the-counter supplements. - Continue current vitamin D supplementation.    Return in about 4 weeks (around 04/13/2024) for Follow Up Weight.       Isaiah DELENA Pepper, MD  [facility] [phone] (phone) [phone] (fax)

## 2024-03-16 NOTE — Patient Instructions (Signed)
 I value your feedback, so if you receive a survey, please take the time to fill it out. Thank you for choosing [facility]!

## 2024-04-14 ENCOUNTER — Ambulatory Visit

## 2024-04-14 VITALS — BP 99/85 | HR 86 | Resp 16 | Wt 189.0 lb

## 2024-04-14 DIAGNOSIS — R7303 Prediabetes: Secondary | ICD-10-CM

## 2024-04-14 DIAGNOSIS — E559 Vitamin D deficiency, unspecified: Secondary | ICD-10-CM

## 2024-04-14 DIAGNOSIS — Z6834 Body mass index (BMI) 34.0-34.9, adult: Secondary | ICD-10-CM | POA: Diagnosis not present

## 2024-04-14 MED ORDER — METFORMIN HCL ER 500 MG PO TB24
1000.0000 mg | ORAL_TABLET | Freq: Every day | ORAL | 3 refills | Status: AC
Start: 2024-04-14 — End: ?

## 2024-04-14 NOTE — Progress Notes (Signed)
 Established patient visit   Patient: Tamara Waller   DOB: 1980-12-23   43 y.o. Female  MRN: [id number] Visit Date: 04/14/2024  Today's healthcare provider: Isaiah DELENA Pepper, MD   Chief Complaint  Patient presents with   Follow-up    4 wt f/u   Subjective    HPI  Discussed the use of AI scribe software for clinical note transcription with the patient, who gave verbal consent to proceed.  History of Present Illness Tamara Waller is a 43 year old female who presents for a follow-up regarding weight management and medication review. She is accompanied by her best friend, who is also her son Regulatory affairs officer for services.  She is actively engaged in weight management efforts, having lost approximately three to four pounds over the past month. Her dietary habits include one main meal a day, often supplemented by snacks like chips. She is working on limiting her sugar intake and is mindful of her food choices.  Her physical activity includes frequent walking and stretching exercises at home. Although she has a photographer, she has not yet utilized it. She also engages in household cleaning, which she considers part of her physical activity routine.  She takes metformin  at night and occasionally experiences nausea when she forgets to eat before taking the medication. She reports increased bowel movements, which she attributes to the medication.  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   Medications: Outpatient Medications Prior to Visit  Medication Sig   albuterol  (VENTOLIN  HFA) 108 (90 Base) MCG/ACT inhaler Inhale 1-2 puffs into the lungs every 4 (four) hours as needed for wheezing or shortness of breath.   cyanocobalamin (VITAMIN B12) 1000 MCG tablet Take 1,000 mcg by mouth daily.   fluticasone  (FLONASE ) 50 MCG/ACT nasal spray Place 2 sprays into both nostrils daily.    Multiple Vitamin (MULTIVITAMIN) tablet Take 1 tablet by mouth daily.   polyethylene glycol powder (MIRALAX ) 17 GM/SCOOP powder Take 17 g by mouth 3 (three) times daily as needed (constipation). Dissolve 1 capful (17g) in 4-8 ounces of liquid and take by mouth daily.   Vitamin D , Cholecalciferol , 25 MCG (1000 UT) CAPS Take 1 capsule by mouth daily.   [DISCONTINUED] metFORMIN  (GLUCOPHAGE -XR) 500 MG 24 hr tablet Take 1 tablet (500 mg total) by mouth daily with breakfast.   zinc gluconate 50 MG tablet Take by mouth. (Patient not taking: Reported on 04/14/2024)   No facility-administered medications prior to visit.    Review of Systems as noted in HPI.      Objective    BP 99/85 (BP Location: Left Arm, Patient Position: Sitting, Cuff Size: Normal)   Pulse 86   Resp 16   Wt 189 lb (85.7 kg)   SpO2 98%   BMI 34.57 kg/m    Physical Exam Constitutional:      Appearance: Normal appearance.  HENT:     Head: Normocephalic and atraumatic.     Mouth/Throat:     Mouth: Mucous membranes are moist.  Eyes:     Pupils: Pupils are equal, round, and reactive to light.  Pulmonary:     Effort: Pulmonary effort is normal.  Skin:    General: Skin is warm.  Neurological:     General: No focal deficit present.     Mental Status: She is alert.      No results found for any visits on  04/14/24.  Assessment & Plan     Problem List Items Addressed This Visit       Other   BMI 34.0-34.9,adult   Prediabetes - Primary   Relevant Medications   metFORMIN  (GLUCOPHAGE -XR) 500 MG 24 hr tablet   Assessment & Plan Prediabetes BMI 34 Chronic. On metformin  500 mg with 3-4 lbs weight loss in one month. Tolerated well with occasional diarrhea and nausea. Discussed increasing to 1000 mg for enhanced weight loss, with potential side effects. Emphasized exercise and dietary optimization. - Increased metformin  to 1000 mg daily. - Encouraged regular exercise and dietary optimization. - Scheduled follow-up  in 3 months to assess weight loss progress.  Vitamin D  deficiency Chronic. Taking vitamin D  supplements. Discussed importance of continued supplementation. - Continue vitamin D  supplementation.   Return in about 3 months (around 07/15/2024) for Follow Up.     Isaiah DELENA Pepper, MD  [facility] [phone] (phone) [phone] (fax)

## 2024-05-07 ENCOUNTER — Encounter: Payer: Self-pay | Admitting: Gastroenterology

## 2024-05-07 NOTE — Anesthesia Preprocedure Evaluation (Signed)
 Anesthesia Evaluation    Airway Mallampati: II       Dental   Pulmonary           Cardiovascular      Neuro/Psych    GI/Hepatic   Endo/Other    Renal/GU      Musculoskeletal   Abdominal   Peds  Hematology   Anesthesia Other Findings Previously thought to have OSA, but patient states test was normal and didn't have OSA  Asthma UTI (urinary tract infection) Pyelonephritis Chlamydia Trichomonas Ovarian cyst Pre-diabetes GERD (gastroesophageal reflux disease) Chronic fatigue      Reproductive/Obstetrics                              Anesthesia Physical Anesthesia Plan  ASA: 2  Anesthesia Plan: General   Post-op Pain Management:    Induction: Intravenous  PONV Risk Score and Plan:   Airway Management Planned: Natural Airway and Nasal Cannula  Additional Equipment:   Intra-op Plan:   Post-operative Plan:   Informed Consent: I have reviewed the patients History and Physical, chart, labs and discussed the procedure including the risks, benefits and alternatives for the proposed anesthesia with the patient or authorized representative who has indicated his/her understanding and acceptance.     Dental Advisory Given  Plan Discussed with: Anesthesiologist, CRNA and Surgeon  Anesthesia Plan Comments: (Patient consented for risks of anesthesia including but not limited to:  - adverse reactions to medications - risk of airway placement if required - damage to eyes, teeth, lips or other oral mucosa - nerve damage due to positioning  - sore throat or hoarseness - Damage to heart, brain, nerves, lungs, other parts of body or loss of life  Patient voiced understanding and assent.)         Anesthesia Quick Evaluation

## 2024-05-25 ENCOUNTER — Ambulatory Visit: Admission: RE | Admit: 2024-05-25 | Source: Home / Self Care | Admitting: Gastroenterology

## 2024-05-25 ENCOUNTER — Encounter: Payer: Self-pay | Admitting: Anesthesiology

## 2024-05-26 MED ORDER — NA SULFATE-K SULFATE-MG SULF 17.5-3.13-1.6 GM/177ML PO SOLN: 354.0000 mL | Freq: Once | ORAL | 0 refills | Status: AC

## 2024-05-26 NOTE — Addendum Note (Signed)
 Addended by: JODIE HEADINGS on: 05/26/2024 09:24 AM   Modules accepted: Orders

## 2024-06-15 ENCOUNTER — Encounter: Admission: RE | Payer: Self-pay | Source: Home / Self Care

## 2024-06-15 ENCOUNTER — Ambulatory Visit: Admit: 2024-06-15 | Admitting: Gastroenterology

## 2024-07-15 ENCOUNTER — Ambulatory Visit
